# Patient Record
Sex: Female | Born: 1965 | Race: Black or African American | Hispanic: No | Marital: Single | Smoking: Current some day smoker
Health system: Southern US, Community
[De-identification: ages and names within clinical notes are randomized; demographics above are authoritative.]

## PROBLEM LIST (undated history)

## (undated) DIAGNOSIS — M1990 Unspecified osteoarthritis, unspecified site: Secondary | ICD-10-CM

## (undated) DIAGNOSIS — K219 Gastro-esophageal reflux disease without esophagitis: Secondary | ICD-10-CM

## (undated) HISTORY — PX: OTHER SURGICAL HISTORY: SHX169

## (undated) HISTORY — PX: ABDOMINAL HYSTERECTOMY: SHX81

## (undated) HISTORY — PX: FACIAL RECONSTRUCTION SURGERY: SHX631

## (undated) HISTORY — PX: FRACTURE SURGERY: SHX138

---

## 2005-06-03 ENCOUNTER — Ambulatory Visit: Payer: Self-pay | Admitting: Family Medicine

## 2005-08-20 ENCOUNTER — Ambulatory Visit: Payer: Self-pay | Admitting: Unknown Physician Specialty

## 2012-04-06 ENCOUNTER — Emergency Department: Payer: Self-pay | Admitting: Emergency Medicine

## 2013-07-02 DIAGNOSIS — M79671 Pain in right foot: Secondary | ICD-10-CM | POA: Insufficient documentation

## 2013-07-02 DIAGNOSIS — IMO0001 Reserved for inherently not codable concepts without codable children: Secondary | ICD-10-CM | POA: Insufficient documentation

## 2019-07-21 ENCOUNTER — Encounter: Payer: Self-pay | Admitting: Family Medicine

## 2019-07-22 ENCOUNTER — Encounter: Payer: Self-pay | Admitting: Family Medicine

## 2019-07-22 ENCOUNTER — Other Ambulatory Visit: Payer: Self-pay

## 2019-07-22 ENCOUNTER — Ambulatory Visit (INDEPENDENT_AMBULATORY_CARE_PROVIDER_SITE_OTHER): Payer: Managed Care, Other (non HMO) | Admitting: Family Medicine

## 2019-07-22 VITALS — BP 110/64 | HR 92 | Ht 65.0 in | Wt 175.0 lb

## 2019-07-22 DIAGNOSIS — Z7689 Persons encountering health services in other specified circumstances: Secondary | ICD-10-CM

## 2019-07-22 DIAGNOSIS — R933 Abnormal findings on diagnostic imaging of other parts of digestive tract: Secondary | ICD-10-CM | POA: Diagnosis not present

## 2019-07-22 DIAGNOSIS — Z1211 Encounter for screening for malignant neoplasm of colon: Secondary | ICD-10-CM

## 2019-07-22 DIAGNOSIS — Z23 Encounter for immunization: Secondary | ICD-10-CM

## 2019-07-22 DIAGNOSIS — Z124 Encounter for screening for malignant neoplasm of cervix: Secondary | ICD-10-CM

## 2019-07-22 DIAGNOSIS — E663 Overweight: Secondary | ICD-10-CM

## 2019-07-22 NOTE — Patient Instructions (Signed)

## 2019-07-22 NOTE — Progress Notes (Signed)
Date:  07/22/2019   Name:  Debbie Harrington   DOB:  March 16, 1966   MRN:  [id number]   Chief Complaint: Establish Care (needed pcp), ref GYN (GYN maintenance and weight loss discussion), and ref GI (Needs baseline colonoscopy)  Patient is a 54 year old female who presents for a establish care exam. The patient reports the following problems: colon cancer screen. Health maintenance has been reviewed colonoscopy/cervical cancer screen/breast cancer screen.   No results found for: CREATININE, BUN, NA, K, CL, CO2 No results found for: CHOL, HDL, LDLCALC, LDLDIRECT, TRIG, CHOLHDL No results found for: TSH No results found for: HGBA1C No results found for: WBC, HGB, HCT, MCV, PLT No results found for: ALT, AST, GGT, ALKPHOS, BILITOT   Review of Systems  Constitutional: Negative for chills and fever.  HENT: Negative for drooling, ear discharge, ear pain, rhinorrhea, sinus pain and sore throat.   Respiratory: Negative for cough, shortness of breath and wheezing.   Cardiovascular: Negative for chest pain, palpitations and leg swelling.  Gastrointestinal: Negative for abdominal pain, blood in stool, constipation, diarrhea and nausea.  Endocrine: Negative for polydipsia and polyuria.  Genitourinary: Negative for dysuria, frequency, hematuria, urgency, vaginal bleeding, vaginal discharge and vaginal pain.  Musculoskeletal: Negative for back pain, myalgias and neck pain.  Skin: Negative for rash.  Allergic/Immunologic: Negative for environmental allergies.  Neurological: Negative for dizziness, weakness and headaches.  Hematological: Does not bruise/bleed easily.  Psychiatric/Behavioral: Negative for suicidal ideas. The patient is not nervous/anxious.     Patient Active Problem List   Diagnosis Date Noted  . Right foot pain 07/02/2013  . Reflux 07/02/2013    No Known Allergies  Past Surgical History:  Procedure Laterality Date  . ABDOMINAL HYSTERECTOMY    . arm fracture    . FACIAL  RECONSTRUCTION SURGERY      Social History   Tobacco Use  . Smoking status: Current Every Day Smoker    Types: Cigarettes  . Smokeless tobacco: Never Used  Substance Use Topics  . Alcohol use: Yes    Comment: socially  . Drug use: Never     Medication list has been reviewed and updated.  No outpatient medications have been marked as taking for the 07/22/19 encounter (Office Visit) with Juline Patch, MD.    [facility] 2/9 Scores 07/22/2019  PHQ - 2 Score 0  PHQ- 9 Score 0    BP Readings from Last 3 Encounters:  07/22/19 110/64    Physical Exam Vitals and nursing note reviewed.  Constitutional:      General: She is not in acute distress.    Appearance: She is not diaphoretic.  HENT:     Head: Normocephalic and atraumatic.     Right Ear: Tympanic membrane, ear canal and external ear normal.     Left Ear: Tympanic membrane, ear canal and external ear normal.     Nose: Nose normal. No congestion or rhinorrhea.  Eyes:     General:        Right eye: No discharge.        Left eye: No discharge.     Conjunctiva/sclera: Conjunctivae normal.     Pupils: Pupils are equal, round, and reactive to light.  Neck:     Thyroid: No thyromegaly.     Vascular: No JVD.  Cardiovascular:     Rate and Rhythm: Normal rate and regular rhythm.     Heart sounds: Normal heart sounds. No murmur. No friction rub. No gallop.  Pulmonary:     Effort: Pulmonary effort is normal.     Breath sounds: Normal breath sounds. No wheezing or rhonchi.  Abdominal:     General: Bowel sounds are normal.     Palpations: Abdomen is soft. There is no mass.     Tenderness: There is no abdominal tenderness. There is no guarding.  Musculoskeletal:        General: Normal range of motion.     Cervical back: Normal range of motion and neck supple.  Lymphadenopathy:     Cervical: No cervical adenopathy.  Skin:    General: Skin is warm and dry.     Findings: No bruising or erythema.  Neurological:     Mental  Status: She is alert.     Deep Tendon Reflexes: Reflexes are normal and symmetric.     Wt Readings from Last 3 Encounters:  07/22/19 175 lb (79.4 kg)    BP 110/64   Pulse 92   Ht 5\' 5"  (1.651 m)   Wt 175 lb (79.4 kg)   BMI 29.12 kg/m    Assessment and Plan: 1. Establishing care with new doctor, encounter for Patient establishing care with new physician.  Review of previous encounters, most recent labs, most recent imaging, as well as care everywhere were reviewed.  2. Need for Tdap vaccination Discussed and administered. - Tdap vaccine greater than or equal to 7yo IM  3. Colon cancer screening Patient desires to proceed with evaluation for colonoscopy. - Ambulatory referral to Gastroenterology  4. Abnormal finding on GI tract imaging In looking at previous imaging it was noted in 2007 patient had barium swallow and that there was some abnormality noted.  Patient has not had any weight loss or abdominal pain but would like to bring this to the attention of gastroenterology whether any further evaluation needs to be done such as endoscopy. - Ambulatory referral to Gastroenterology  5. Overweight (BMI 25.0-29.9) Health risks of being over weight were discussed and patient was counseled on weight loss options and exercise.  6. Cervical cancer screening Patient needs to be evaluated for cervical cancer screening by gynecology.  Patient used to see 01-25-2001 but now she would like to see Dr. Willeen Cass for weight loss and continued GYN care. - Ambulatory referral to Gynecology

## 2019-08-04 ENCOUNTER — Other Ambulatory Visit: Payer: Self-pay | Admitting: Obstetrics and Gynecology

## 2019-08-04 DIAGNOSIS — Z1231 Encounter for screening mammogram for malignant neoplasm of breast: Secondary | ICD-10-CM

## 2019-08-13 LAB — HM PAP SMEAR: HM Pap smear: NORMAL

## 2019-08-24 ENCOUNTER — Ambulatory Visit
Admission: RE | Admit: 2019-08-24 | Discharge: 2019-08-24 | Disposition: A | Discharge status: Home / Self Care | Payer: Managed Care, Other (non HMO) | Source: Ambulatory Visit | Attending: Obstetrics and Gynecology | Admitting: Obstetrics and Gynecology

## 2019-08-24 ENCOUNTER — Other Ambulatory Visit: Payer: Self-pay

## 2019-08-24 DIAGNOSIS — Z1231 Encounter for screening mammogram for malignant neoplasm of breast: Secondary | ICD-10-CM

## 2019-08-27 ENCOUNTER — Inpatient Hospital Stay
Admission: RE | Admit: 2019-08-27 | Discharge: 2019-08-27 | Disposition: A | Discharge status: Home / Self Care | Payer: Self-pay | Source: Ambulatory Visit | Attending: *Deleted | Admitting: *Deleted

## 2019-08-27 ENCOUNTER — Other Ambulatory Visit: Payer: Self-pay | Admitting: *Deleted

## 2019-08-27 DIAGNOSIS — Z1231 Encounter for screening mammogram for malignant neoplasm of breast: Secondary | ICD-10-CM

## 2019-10-28 ENCOUNTER — Other Ambulatory Visit: Payer: Self-pay

## 2019-10-28 ENCOUNTER — Ambulatory Visit (INDEPENDENT_AMBULATORY_CARE_PROVIDER_SITE_OTHER): Payer: Managed Care, Other (non HMO) | Admitting: Family Medicine

## 2019-10-28 ENCOUNTER — Encounter: Payer: Self-pay | Admitting: Family Medicine

## 2019-10-28 VITALS — BP 128/88 | HR 88 | Ht 65.0 in | Wt 172.0 lb

## 2019-10-28 DIAGNOSIS — M7661 Achilles tendinitis, right leg: Secondary | ICD-10-CM

## 2019-10-28 DIAGNOSIS — M722 Plantar fascial fibromatosis: Secondary | ICD-10-CM | POA: Diagnosis not present

## 2019-10-28 MED ORDER — MELOXICAM 15 MG PO TABS: 15.0000 mg | ORAL_TABLET | Freq: Every day | ORAL | 0 refills | Status: DC

## 2019-10-28 NOTE — Progress Notes (Signed)
Date:  10/28/2019   Name:  Debbie Harrington   DOB:  1965-08-31   MRN:  [id number]   Chief Complaint: Follow-up (tendonitis right ankle- doing good with boot on(took boot off 10/16/19) ,put brace on 10/21/19 back or ankle and heel hurts sends sharp pain up legs when she puts pressure on it )  Leg Pain  The incident occurred more than 1 week ago. There was no injury mechanism. The pain is present in the right foot. The quality of the pain is described as aching. The pain is moderate. The pain has been constant since onset. Pertinent negatives include no inability to bear weight, loss of motion, loss of sensation, muscle weakness or numbness. Associated symptoms comments: Pain with walking. She has tried NSAIDs for the symptoms. The treatment provided moderate relief.    No results found for: CREATININE, BUN, NA, K, CL, CO2 No results found for: CHOL, HDL, LDLCALC, LDLDIRECT, TRIG, CHOLHDL No results found for: TSH No results found for: HGBA1C No results found for: WBC, HGB, HCT, MCV, PLT No results found for: ALT, AST, GGT, ALKPHOS, BILITOT   Review of Systems  Constitutional: Negative.  Negative for chills, fatigue, fever and unexpected weight change.  HENT: Negative for congestion, ear discharge, ear pain, rhinorrhea, sinus pressure, sneezing and sore throat.   Eyes: Negative for photophobia, pain, discharge, redness and itching.  Respiratory: Negative for cough, shortness of breath, wheezing and stridor.   Gastrointestinal: Negative for abdominal pain, blood in stool, constipation, diarrhea, nausea and vomiting.  Endocrine: Negative for cold intolerance, heat intolerance, polydipsia, polyphagia and polyuria.  Genitourinary: Negative for dysuria, flank pain, frequency, hematuria, menstrual problem, pelvic pain, urgency, vaginal bleeding and vaginal discharge.  Musculoskeletal: Negative for arthralgias, back pain and myalgias.  Skin: Negative for rash.  Allergic/Immunologic: Negative for  environmental allergies and food allergies.  Neurological: Negative for dizziness, weakness, light-headedness, numbness and headaches.  Hematological: Negative for adenopathy. Does not bruise/bleed easily.  Psychiatric/Behavioral: Negative for dysphoric mood. The patient is not nervous/anxious.     Patient Active Problem List   Diagnosis Date Noted  . Right foot pain 07/02/2013  . Reflux 07/02/2013    No Known Allergies  Past Surgical History:  Procedure Laterality Date  . ABDOMINAL HYSTERECTOMY    . arm fracture    . FACIAL RECONSTRUCTION SURGERY      Social History   Tobacco Use  . Smoking status: Current Every Day Smoker    Types: Cigarettes  . Smokeless tobacco: Never Used  Substance Use Topics  . Alcohol use: Yes    Comment: socially  . Drug use: Never     Medication list has been reviewed and updated.  Current Meds  Medication Sig  . ibuprofen (ADVIL) 600 MG tablet Take 600 mg by mouth every 6 (six) hours as needed.    PHQ 2/9 Scores 10/28/2019 07/22/2019  PHQ - 2 Score 0 0  PHQ- 9 Score 0 0    GAD 7 : Generalized Anxiety Score 10/28/2019 07/22/2019  Nervous, Anxious, on Edge 0 0  Control/stop worrying 0 0  Worry too much - different things 0 0  Trouble relaxing 0 0  Restless 0 0  Easily annoyed or irritable 0 0  Afraid - awful might happen 0 0  Total GAD 7 Score 0 0  Anxiety Difficulty Not difficult at all -    BP Readings from Last 3 Encounters:  10/28/19 128/88  07/22/19 110/64    Physical Exam Vitals and  nursing note reviewed.  Constitutional:      Appearance: She is well-developed.  HENT:     Head: Normocephalic.     Right Ear: Tympanic membrane, ear canal and external ear normal.     Left Ear: Tympanic membrane, ear canal and external ear normal.  Eyes:     General: Lids are everted, no foreign bodies appreciated. No scleral icterus.       Left eye: No foreign body or hordeolum.     Conjunctiva/sclera: Conjunctivae normal.     Right  eye: Right conjunctiva is not injected.     Left eye: Left conjunctiva is not injected.     Pupils: Pupils are equal, round, and reactive to light.  Neck:     Thyroid: No thyromegaly.     Vascular: No JVD.     Trachea: No tracheal deviation.  Cardiovascular:     Rate and Rhythm: Normal rate and regular rhythm.     Heart sounds: Normal heart sounds. No murmur heard.  No friction rub. No gallop.   Pulmonary:     Effort: Pulmonary effort is normal. No respiratory distress.     Breath sounds: Normal breath sounds. No wheezing or rales.  Abdominal:     General: Bowel sounds are normal.     Palpations: Abdomen is soft. There is no mass.     Tenderness: There is no abdominal tenderness. There is no guarding or rebound.  Musculoskeletal:        General: Normal range of motion.     Cervical back: Normal range of motion and neck supple.     Right ankle: Tenderness present.     Right Achilles Tendon: Tenderness present. Thompson's test positive.     Right foot: Tenderness present.     Comments: Plantar fascia origin  Lymphadenopathy:     Cervical: No cervical adenopathy.  Skin:    General: Skin is warm.     Findings: No rash.  Neurological:     Mental Status: She is alert and oriented to person, place, and time.     Cranial Nerves: No cranial nerve deficit.     Deep Tendon Reflexes: Reflexes normal.  Psychiatric:        Mood and Affect: Mood is not anxious or depressed.     Wt Readings from Last 3 Encounters:  10/28/19 172 lb (78 kg)  07/22/19 175 lb (79.4 kg)    BP 128/88   Pulse 88   Ht 5\' 5"  (1.651 m)   Wt 172 lb (78 kg)   SpO2 99%   BMI 28.62 kg/m   Assessment and Plan:  1. Achilles tendinitis of right lower extremity New onset.  Persistent.  Despite using a walking boot and a brace for several weeks as well as ibuprofen patient has only had partial relief of discomfort of the Achilles tendinitis.  We will refer to podiatry and will replace ibuprofen with meloxicam 15  mg once a day. - meloxicam (MOBIC) 15 MG tablet; Take 1 tablet (15 mg total) by mouth daily.  Dispense: 30 tablet; Refill: 0 - Ambulatory referral to Podiatry  2. Plantar fasciitis New onset developed after wearing boot.  Persistent.  Patient has been wearing shoes with no arch support such as flip-flops or light loafers.  Patient's developed a plantar fasciitis of the same foot with the Achilles tendinitis probably due to abnormal walking with boot or brace.  As noted above we have initiated meloxicam 15 mg daily and patient is referred to  podiatry for evaluation for this as well. - meloxicam (MOBIC) 15 MG tablet; Take 1 tablet (15 mg total) by mouth daily.  Dispense: 30 tablet; Refill: 0 - Ambulatory referral to Podiatry

## 2019-10-28 NOTE — Patient Instructions (Signed)
Plantar Fasciitis  Plantar fasciitis is a painful foot condition that affects the heel. It occurs when the band of tissue that connects the toes to the heel bone (plantar fascia) becomes irritated. This can happen as the result of exercising too much or doing other repetitive activities (overuse injury). The pain from plantar fasciitis can range from mild irritation to severe pain that makes it difficult to walk or move. The pain is usually worse in the morning after sleeping, or after sitting or lying down for a while. Pain may also be worse after long periods of walking or standing. What are the causes? This condition may be caused by:  Standing for long periods of time.  Wearing shoes that do not have good arch support.  Doing activities that put stress on joints (high-impact activities), including running, aerobics, and ballet.  Being overweight.  An abnormal way of walking (gait).  Tight muscles in the back of your lower leg (calf).  High arches in your feet.  Starting a new athletic activity. What are the signs or symptoms? The main symptom of this condition is heel pain. Pain may:  Be worse with first steps after a time of rest, especially in the morning after sleeping or after you have been sitting or lying down for a while.  Be worse after long periods of standing still.  Decrease after 30-45 minutes of activity, such as gentle walking. How is this diagnosed? This condition may be diagnosed based on your medical history and your symptoms. Your health care provider may ask questions about your activity level. Your health care provider will do a physical exam to check for:  A tender area on the bottom of your foot.  A high arch in your foot.  Pain when you move your foot.  Difficulty moving your foot. You may have imaging tests to confirm the diagnosis, such as:  X-rays.  Ultrasound.  MRI. How is this treated? Treatment for plantar fasciitis depends on how  severe your condition is. Treatment may include:  Rest, ice, applying pressure (compression), and raising the affected foot (elevation). This may be called RICE therapy. Your health care provider may recommend RICE therapy along with over-the-counter pain medicines to manage your pain.  Exercises to stretch your calves and your plantar fascia.  A splint that holds your foot in a stretched, upward position while you sleep (night splint).  Physical therapy to relieve symptoms and prevent problems in the future.  Injections of steroid medicine (cortisone) to relieve pain and inflammation.  Stimulating your plantar fascia with electrical impulses (extracorporeal shock wave therapy). This is usually the last treatment option before surgery.  Surgery, if other treatments have not worked after 12 months. Follow these instructions at home:  Managing pain, stiffness, and swelling  If directed, put ice on the painful area: ? Put ice in a plastic bag, or use a frozen bottle of water. ? Place a towel between your skin and the bag or bottle. ? Roll the bottom of your foot over the bag or bottle. ? Do this for 20 minutes, 2-3 times a day.  Wear athletic shoes that have air-sole or gel-sole cushions, or try wearing soft shoe inserts that are designed for plantar fasciitis.  Raise (elevate) your foot above the level of your heart while you are sitting or lying down. Activity  Avoid activities that cause pain. Ask your health care provider what activities are safe for you.  Do physical therapy exercises and stretches as told   by your health care provider.  Try activities and forms of exercise that are easier on your joints (low-impact). Examples include swimming, water aerobics, and biking. General instructions  Take over-the-counter and prescription medicines only as told by your health care provider.  Wear a night splint while sleeping, if told by your health care provider. Loosen the splint  if your toes tingle, become numb, or turn cold and blue.  Maintain a healthy weight, or work with your health care provider to lose weight as needed.  Keep all follow-up visits as told by your health care provider. This is important. Contact a health care provider if you:  Have symptoms that do not go away after caring for yourself at home.  Have pain that gets worse.  Have pain that affects your ability to move or do your daily activities. Summary  Plantar fasciitis is a painful foot condition that affects the heel. It occurs when the band of tissue that connects the toes to the heel bone (plantar fascia) becomes irritated.  The main symptom of this condition is heel pain that may be worse after exercising too much or standing still for a long time.  Treatment varies, but it usually starts with rest, ice, compression, and elevation (RICE therapy) and over-the-counter medicines to manage pain. This information is not intended to replace advice given to you by your health care provider. Make sure you discuss any questions you have with your health care provider. Document Revised: 02/21/2017 Document Reviewed: 01/06/2017 Elsevier Patient Education  2020 [employer] Inc[employer] Rosen's Emergency Medicine: Concepts and Clinical Practice (9th ed., pp. [phone]). [place], [place]: Else[employer] Inc. Retrieved from https://www.clinicalkey.com/#!/content/book/3-s2.0-B9780323354790001070?scrollTo=%23hl0000251">  Achilles Tendinitis  Achilles tendinitis is inflammation of the tough, cord-like band that attaches the lower leg muscles to the heel bone (Achilles tendon). This is usually caused by overusing the tendon and the ankle joint. Achilles tendinitis usually gets better over time with treatment and caring for yourself at home. It can take weeks or months to heal completely. What are the causes? This condition may be caused by:  A sudden increase in exercise or activity, such as running.  Doing the same  exercises or activities, such as jumping, over and over.  Not warming up calf muscles before exercising.  Exercising in shoes that are worn out or not made for exercise.  Having arthritis or a bone growth (spur) on the back of the heel bone. This can rub against the tendon and hurt it.  Age-related wear and tear. Tendons become less flexible with age and are more likely to be injured. What are the signs or symptoms? Common symptoms of this condition include:  Pain in the Achilles tendon or in the back of the leg, just above the heel. The pain usually gets worse with exercise.  Stiffness or soreness in the back of the leg, especially in the morning.  Swelling of the skin over the Achilles tendon.  Thickening of the tendon.  Trouble standing on tiptoe. How is this diagnosed? This condition is diagnosed based on your symptoms and a physical exam. You may have tests, including:  X-rays.  MRI. How is this treated? The goal of treatment is to relieve symptoms and help your injury heal. Treatment may include:  Decreasing or stopping activities that caused the tendinitis. This may mean switching to low-impact exercises like biking or swimming.  Icing the injured area.  Doing physical therapy, including strengthening and stretching exercises.  Taking NSAIDs, such as ibuprofen, to help relieve pain and  swelling.  Using supportive shoes, wraps, heel lifts, or a walking boot (air cast).  Having surgery. This may be done if your symptoms do not improve after other treatments.  Using high-energy shock wave impulses to stimulate the healing process (extracorporeal shock wave therapy). This is rare.  Having an injection of medicines that help relieve inflammation (corticosteroids). This is rare. Follow these instructions at home: If you have an air cast:  Wear the air cast as told by your health care provider. Remove it only as told by your health care provider.  Loosen it if your  toes tingle, become numb, or turn cold and blue.  Keep it clean.  If the air cast is not waterproof: ? Do not let it get wet. ? Cover it with a watertight covering when you take a bath or shower. Managing pain, stiffness, and swelling   If directed, put ice on the injured area. To do this: ? If you have a removable air cast, remove it as told by your health care provider. ? Put ice in a plastic bag. ? Place a towel between your skin and the bag. ? Leave the ice on for 20 minutes, 2-3 times a day.  Move your toes often to reduce stiffness and swelling.  Raise (elevate) your foot above the level of your heart while you are sitting or lying down. Activity  Gradually return to your normal activities as told by your health care provider. Ask your health care provider what activities are safe for you.  Do not do activities that cause pain.  Consider doing low-impact exercises, like cycling or swimming.  Ask your health care provider when it is safe to drive if you have an air cast on your foot.  If physical therapy was prescribed, do exercises as told by your health care provider or physical therapist. General instructions  If directed, wrap your foot with an elastic bandage or other wrap. This can help to keep your tendon from moving too much while it heals. Your health care provider will show you how to wrap your foot correctly.  Wear supportive shoes or heel lifts only as told by your health care provider.  Take over-the-counter and prescription medicines only as told by your health care provider.  Keep all follow-up visits as told by your health care provider. This is important. Contact a health care provider if you:  Have symptoms that get worse.  Have pain that does not get better with medicine.  Develop new, unexplained symptoms.  Develop warmth and swelling in your foot.  Have a fever. Get help right away if you:  Have a sudden popping sound or sensation in your  Achilles tendon followed by severe pain.  Cannot move your toes or foot.  Cannot put any weight on your foot.  Your foot or toes become numb and look white or blue even after loosening your bandage or air cast. Summary  Achilles tendinitis is inflammation of the tough, cord-like band that attaches the lower leg muscles to the heel bone (Achilles tendon).  This condition is usually caused by overusing the tendon and the ankle joint. It can also be caused by arthritis or normal aging.  The most common symptoms of this condition include pain, swelling, or stiffness in the Achilles tendon or in the back of the leg.  This condition is usually treated by decreasing or stopping activities that caused the tendinitis, icing the injured area, taking NSAIDs, and doing physical therapy. This information is  not intended to replace advice given to you by your health care provider. Make sure you discuss any questions you have with your health care provider. Document Revised: 07/27/2018 Document Reviewed: 07/27/2018 Elsevier Patient Education  2020 [employer].

## 2019-11-11 ENCOUNTER — Telehealth: Payer: Self-pay

## 2019-11-11 NOTE — Telephone Encounter (Signed)
Called and spoke with patient. She dropped off a handicap placard form for Dr Yetta Barre to fill out because she has foot pain. Explained to her if she does not have CHF, or is not disabled in a wheelchair then Dr Yetta Barre cannot fill this out for her.   She said podiatry told her to ask [place] but she verbalize understanding of this .    CM

## 2019-11-24 ENCOUNTER — Other Ambulatory Visit: Payer: Self-pay | Admitting: Family Medicine

## 2019-11-24 DIAGNOSIS — M722 Plantar fascial fibromatosis: Secondary | ICD-10-CM

## 2019-11-24 DIAGNOSIS — M7661 Achilles tendinitis, right leg: Secondary | ICD-10-CM

## 2020-01-13 ENCOUNTER — Other Ambulatory Visit
Admission: RE | Admit: 2020-01-13 | Discharge: 2020-01-13 | Disposition: A | Discharge status: Home / Self Care | Payer: Managed Care, Other (non HMO) | Source: Ambulatory Visit | Attending: Gastroenterology | Admitting: Gastroenterology

## 2020-01-13 ENCOUNTER — Other Ambulatory Visit: Payer: Self-pay

## 2020-01-13 DIAGNOSIS — Z01812 Encounter for preprocedural laboratory examination: Secondary | ICD-10-CM | POA: Insufficient documentation

## 2020-01-13 DIAGNOSIS — Z20822 Contact with and (suspected) exposure to covid-19: Secondary | ICD-10-CM | POA: Insufficient documentation

## 2020-01-14 ENCOUNTER — Encounter: Payer: Self-pay | Admitting: *Deleted

## 2020-01-14 LAB — SARS CORONAVIRUS 2 (TAT 6-24 HRS): SARS Coronavirus 2: NEGATIVE

## 2020-01-17 ENCOUNTER — Ambulatory Visit
Admission: RE | Admit: 2020-01-17 | Discharge: 2020-01-17 | Disposition: A | Discharge status: Home / Self Care | Payer: Managed Care, Other (non HMO) | Attending: Gastroenterology | Admitting: Gastroenterology

## 2020-01-17 ENCOUNTER — Ambulatory Visit: Payer: Managed Care, Other (non HMO) | Admitting: Anesthesiology

## 2020-01-17 ENCOUNTER — Encounter
Admission: RE | Disposition: A | Discharge status: Home / Self Care | Payer: Self-pay | Source: Home / Self Care | Attending: Gastroenterology

## 2020-01-17 ENCOUNTER — Other Ambulatory Visit: Payer: Self-pay

## 2020-01-17 ENCOUNTER — Encounter: Payer: Self-pay | Admitting: *Deleted

## 2020-01-17 DIAGNOSIS — M1990 Unspecified osteoarthritis, unspecified site: Secondary | ICD-10-CM | POA: Insufficient documentation

## 2020-01-17 DIAGNOSIS — K640 First degree hemorrhoids: Secondary | ICD-10-CM | POA: Diagnosis not present

## 2020-01-17 DIAGNOSIS — Z791 Long term (current) use of non-steroidal anti-inflammatories (NSAID): Secondary | ICD-10-CM | POA: Diagnosis not present

## 2020-01-17 DIAGNOSIS — K621 Rectal polyp: Secondary | ICD-10-CM | POA: Insufficient documentation

## 2020-01-17 DIAGNOSIS — Z1211 Encounter for screening for malignant neoplasm of colon: Secondary | ICD-10-CM | POA: Diagnosis present

## 2020-01-17 DIAGNOSIS — K635 Polyp of colon: Secondary | ICD-10-CM | POA: Insufficient documentation

## 2020-01-17 DIAGNOSIS — F17200 Nicotine dependence, unspecified, uncomplicated: Secondary | ICD-10-CM | POA: Insufficient documentation

## 2020-01-17 HISTORY — PX: COLONOSCOPY WITH PROPOFOL: SHX5780

## 2020-01-17 HISTORY — DX: Unspecified osteoarthritis, unspecified site: M19.90

## 2020-01-17 HISTORY — DX: Gastro-esophageal reflux disease without esophagitis: K21.9

## 2020-01-17 SURGERY — COLONOSCOPY WITH PROPOFOL
Anesthesia: General

## 2020-01-17 MED ORDER — SODIUM CHLORIDE 0.9 % IV SOLN: INTRAVENOUS | Status: DC

## 2020-01-17 MED ORDER — PROPOFOL 10 MG/ML IV BOLUS
INTRAVENOUS | Status: DC | PRN
  Administered 2020-01-17: 100 mg via INTRAVENOUS

## 2020-01-17 MED ORDER — PROPOFOL 500 MG/50ML IV EMUL
INTRAVENOUS | Status: DC | PRN
  Administered 2020-01-17: 130 ug/kg/min via INTRAVENOUS

## 2020-01-17 MED ORDER — PROPOFOL 500 MG/50ML IV EMUL
INTRAVENOUS | Status: AC
  Filled 2020-01-17: qty 50

## 2020-01-17 NOTE — H&P (Signed)
Outpatient short stay form Pre-procedure 01/17/2020 9:30 AM Merlyn Lot MD, MPH  Primary Physician: Dr. Yetta Barre  Reason for visit:  Screening  History of present illness:   54 y/o lady with no family history of GI malignancies here for screening colonoscopy. History of hysterectomy. No blood thinners. Had colonoscopy many years ago but we do not have these records.    Current Facility-Administered Medications:  .  0.9 %  sodium chloride infusion, , Intravenous, Continuous, Keino Placencia, Rossie Muskrat, MD, Last Rate: 20 mL/hr at 01/17/20 0913, New Bag at 01/17/20 0913  Medications Prior to Admission  Medication Sig Dispense Refill Last Dose  . ibuprofen (ADVIL) 600 MG tablet Take 600 mg by mouth every 6 (six) hours as needed.   Past Week at Unknown time  . meloxicam (MOBIC) 15 MG tablet TAKE 1 TABLET(15 MG) BY MOUTH DAILY 30 tablet 0 Past Week at Unknown time     No Known Allergies   Past Medical History:  Diagnosis Date  . Arthritis   . GERD (gastroesophageal reflux disease)     Review of systems:  Otherwise negative.    Physical Exam  Gen: Alert, oriented. Appears stated age.  HEENT: [place]/AT. PERRLA. Lungs: No respiratory distress CV: RRR Abd: soft, benign, no masses. Ext: No edema.     Planned procedures: Proceed with colonoscopy. The patient understands the nature of the planned procedure, indications, risks, alternatives and potential complications including but not limited to bleeding, infection, perforation, damage to internal organs and possible oversedation/side effects from anesthesia. The patient agrees and gives consent to proceed.  Please refer to procedure notes for findings, recommendations and patient disposition/instructions.     Merlyn Lot MD, MPH Gastroenterology 01/17/2020  9:30 AM

## 2020-01-17 NOTE — Interval H&P Note (Signed)
History and Physical Interval Note:  01/17/2020 9:32 AM  Debbie Harrington  has presented today for surgery, with the diagnosis of SCREEN.  The various methods of treatment have been discussed with the patient and family. After consideration of risks, benefits and other options for treatment, the patient has consented to  Procedure(s): COLONOSCOPY WITH PROPOFOL (N/A) as a surgical intervention.  The patient's history has been reviewed, patient examined, no change in status, stable for surgery.  I have reviewed the patient's chart and labs.  Questions were answered to the patient's satisfaction.     Regis Bill  Ok to proceed with colonoscopy

## 2020-01-17 NOTE — Transfer of Care (Signed)
Immediate Anesthesia Transfer of Care Note  Patient: Debbie Harrington  Procedure(s) Performed: COLONOSCOPY WITH PROPOFOL (N/A )  Patient Location: Endoscopy Unit  Anesthesia Type:General  Level of Consciousness: drowsy and patient cooperative  Airway & Oxygen Therapy: Patient Spontanous Breathing  Post-op Assessment: Report given to RN and Post -op Vital signs reviewed and stable  Post vital signs: Reviewed and stable  Last Vitals:  Vitals Value Taken Time  BP 102/74 01/17/20 1002  Temp 36.1 C 01/17/20 1002  Pulse 72 01/17/20 1004  Resp 19 01/17/20 1004  SpO2 99 % 01/17/20 1004  Vitals shown include unvalidated device data.  Last Pain:  Vitals:   01/17/20 1002  TempSrc: Temporal  PainSc: Asleep         Complications: No complications documented.

## 2020-01-17 NOTE — Op Note (Signed)
[facility] Patient Name: Debbie Harrington Procedure Date: 01/17/2020 9:33 AM MRN: [id number] Account #: [id number] Date of Birth: 1965-04-15 Admit Type: Outpatient Age: 54 Room: [facility] ENDO ROOM 4 Gender: Female Note Status: Finalized Procedure:             Colonoscopy Indications:           Screening for colorectal malignant neoplasm Providers:             Andrey Farmer MD, MD Medicines:             Monitored Anesthesia Care Complications:         No immediate complications. Estimated blood loss:                         Minimal. Procedure:             Pre-Anesthesia Assessment:                        - Prior to the procedure, a History and Physical was                         performed, and patient medications and allergies were                         reviewed. The patient is competent. The risks and                         benefits of the procedure and the sedation options and                         risks were discussed with the patient. All questions                         were answered and informed consent was obtained.                         Patient identification and proposed procedure were                         verified by the physician, the nurse, the anesthetist                         and the technician in the endoscopy suite. Mental                         Status Examination: alert and oriented. Airway                         Examination: normal oropharyngeal airway and neck                         mobility. Respiratory Examination: clear to                         auscultation. CV Examination: normal. Prophylactic                         Antibiotics: The patient does not require prophylactic  antibiotics. Prior Anticoagulants: The patient has                         taken no previous anticoagulant or antiplatelet                         agents. ASA Grade Assessment: II - A patient with mild                          systemic disease. After reviewing the risks and                         benefits, the patient was deemed in satisfactory                         condition to undergo the procedure. The anesthesia                         plan was to use monitored anesthesia care (MAC).                         Immediately prior to administration of medications,                         the patient was re-assessed for adequacy to receive                         sedatives. The heart rate, respiratory rate, oxygen                         saturations, blood pressure, adequacy of pulmonary                         ventilation, and response to care were monitored                         throughout the procedure. The physical status of the                         patient was re-assessed after the procedure.                        After obtaining informed consent, the colonoscope was                         passed under direct vision. Throughout the procedure,                         the patient's blood pressure, pulse, and oxygen                         saturations were monitored continuously. The                         Colonoscope was introduced through the anus and                         advanced to the the terminal ileum. The colonoscopy  was performed without difficulty. The patient                         tolerated the procedure well. The quality of the bowel                         preparation was excellent. Findings:      The perianal and digital rectal examinations were normal.      The terminal ileum appeared normal.      A 3 mm polyp was found in the sigmoid colon. The polyp was sessile. The       polyp was removed with a cold snare. Resection and retrieval were       complete. Estimated blood loss was minimal.      Three sessile polyps were found in the rectum. The polyps were 2 to 3 mm       in size. These polyps were removed with a cold snare. Resection and        retrieval were complete. Estimated blood loss was minimal.      Non-bleeding internal hemorrhoids were found during retroflexion. The       hemorrhoids were Grade I (internal hemorrhoids that do not prolapse).      The exam was otherwise without abnormality on direct and retroflexion       views. Impression:            - The examined portion of the ileum was normal.                        - One 3 mm polyp in the sigmoid colon, removed with a                         cold snare. Resected and retrieved.                        - Three 2 to 3 mm polyps in the rectum, removed with a                         cold snare. Resected and retrieved.                        - Non-bleeding internal hemorrhoids.                        - The examination was otherwise normal on direct and                         retroflexion views. Recommendation:        - Discharge patient to home.                        - Resume previous diet.                        - Continue present medications.                        - Await pathology results.                        - Repeat colonoscopy for surveillance  based on                         pathology results.                        - Return to referring physician as previously                         scheduled. Procedure Code(s):     --- Professional ---                        [phone], Colonoscopy, flexible; with removal of                         tumor(s), polyp(s), or other lesion(s) by snare                         technique Diagnosis Code(s):     --- Professional ---                        Z12.11, Encounter for screening for malignant neoplasm                         of colon                        K64.0, First degree hemorrhoids                        K63.5, Polyp of colon                        K62.1, Rectal polyp CPT copyright 2019 American Medical Association. All rights reserved. The codes documented in this report are preliminary and upon coder review may  be  revised to meet current compliance requirements. Andrey Farmer, MD Andrey Farmer MD, MD 01/17/2020 10:01:03 AM Number of Addenda: 0 Note Initiated On: 01/17/2020 9:33 AM Scope Withdrawal Time: 0 hours 13 minutes 6 seconds  Total Procedure Duration: 0 hours 16 minutes 38 seconds  Estimated Blood Loss:  Estimated blood loss was minimal.      [facility]

## 2020-01-17 NOTE — Anesthesia Preprocedure Evaluation (Signed)
Anesthesia Evaluation  Patient identified by MRN, date of birth, ID band Patient awake    Reviewed: Allergy & Precautions, H&P , NPO status , Patient's Chart, lab work & pertinent test results, reviewed documented beta blocker date and time   History of Anesthesia Complications Negative for: history of anesthetic complications  Airway Mallampati: III  TM Distance: >3 FB Neck ROM: full    Dental  (+) Dental Advidsory Given, Teeth Intact, Missing   Pulmonary neg shortness of breath, neg COPD, neg recent URI, Current Smoker and Patient abstained from smoking.,    Pulmonary exam normal breath sounds clear to auscultation       Cardiovascular Exercise Tolerance: Good negative cardio ROS Normal cardiovascular exam Rhythm:regular Rate:Normal     Neuro/Psych negative neurological ROS  negative psych ROS   GI/Hepatic Neg liver ROS, GERD  ,  Endo/Other  negative endocrine ROS  Renal/GU negative Renal ROS  negative genitourinary   Musculoskeletal   Abdominal   Peds  Hematology negative hematology ROS (+)   Anesthesia Other Findings Past Medical History: No date: Arthritis No date: GERD (gastroesophageal reflux disease)   Reproductive/Obstetrics negative OB ROS                             Anesthesia Physical Anesthesia Plan  ASA: II  Anesthesia Plan: General   Post-op Pain Management:    Induction: Intravenous  PONV Risk Score and Plan: 2 and Propofol infusion and TIVA  Airway Management Planned: Natural Airway and Nasal Cannula  Additional Equipment:   Intra-op Plan:   Post-operative Plan:   Informed Consent: I have reviewed the patients History and Physical, chart, labs and discussed the procedure including the risks, benefits and alternatives for the proposed anesthesia with the patient or authorized representative who has indicated his/her understanding and acceptance.      Dental Advisory Given  Plan Discussed with: Anesthesiologist, CRNA and Surgeon  Anesthesia Plan Comments:         Anesthesia Quick Evaluation

## 2020-01-17 NOTE — Anesthesia Postprocedure Evaluation (Signed)
Anesthesia Post Note  Patient: Debbie Harrington  Procedure(s) Performed: COLONOSCOPY WITH PROPOFOL (N/A )  Patient location during evaluation: Endoscopy Anesthesia Type: General Level of consciousness: awake and alert Pain management: pain level controlled Vital Signs Assessment: post-procedure vital signs reviewed and stable Respiratory status: spontaneous breathing, nonlabored ventilation, respiratory function stable and patient connected to nasal cannula oxygen Cardiovascular status: blood pressure returned to baseline and stable Postop Assessment: no apparent nausea or vomiting Anesthetic complications: no   No complications documented.   Last Vitals:  Vitals:   01/17/20 1012 01/17/20 1022  BP: 116/84 116/77  Pulse: 68 60  Resp: 20 11  Temp:    SpO2: 100% 100%    Last Pain:  Vitals:   01/17/20 1022  TempSrc:   PainSc: 0-No pain                 Lenard Simmer

## 2020-01-18 LAB — SURGICAL PATHOLOGY

## 2020-01-19 ENCOUNTER — Encounter: Payer: Self-pay | Admitting: Gastroenterology

## 2021-02-07 ENCOUNTER — Ambulatory Visit: Payer: Self-pay | Admitting: *Deleted

## 2021-02-07 ENCOUNTER — Encounter: Payer: Self-pay | Admitting: Family Medicine

## 2021-02-07 ENCOUNTER — Other Ambulatory Visit: Payer: Self-pay

## 2021-02-07 ENCOUNTER — Ambulatory Visit
Admission: RE | Admit: 2021-02-07 | Discharge: 2021-02-07 | Disposition: A | Discharge status: Home / Self Care | Payer: Self-pay | Source: Ambulatory Visit | Attending: Family Medicine | Admitting: Family Medicine

## 2021-02-07 ENCOUNTER — Ambulatory Visit
Admission: RE | Admit: 2021-02-07 | Discharge: 2021-02-07 | Disposition: A | Discharge status: Home / Self Care | Payer: Self-pay | Attending: Family Medicine | Admitting: Family Medicine

## 2021-02-07 ENCOUNTER — Ambulatory Visit (INDEPENDENT_AMBULATORY_CARE_PROVIDER_SITE_OTHER): Payer: Self-pay | Admitting: Family Medicine

## 2021-02-07 VITALS — BP 128/92 | HR 78 | Ht 65.0 in | Wt 183.0 lb

## 2021-02-07 DIAGNOSIS — M722 Plantar fascial fibromatosis: Secondary | ICD-10-CM

## 2021-02-07 DIAGNOSIS — M25561 Pain in right knee: Secondary | ICD-10-CM | POA: Insufficient documentation

## 2021-02-07 DIAGNOSIS — M7661 Achilles tendinitis, right leg: Secondary | ICD-10-CM

## 2021-02-07 MED ORDER — MELOXICAM 15 MG PO TABS: ORAL_TABLET | ORAL | 1 refills | Status: DC

## 2021-02-07 NOTE — Progress Notes (Signed)
Date:  02/07/2021   Name:  Debbie Harrington   DOB:  April 04, 1965   MRN:  [id number]   Chief Complaint: No chief complaint on file.  Knee Pain  The incident occurred 12 to 24 hours ago. The incident occurred at home. The injury mechanism was a fall (awkward out of truck/va). The pain is present in the right knee. The pain is at a severity of 6/10 (on ibuprofen). The pain is moderate. The pain has been Constant since onset. Associated symptoms include an inability to bear weight. Pertinent negatives include no loss of motion, loss of sensation, muscle weakness, numbness or tingling. The symptoms are aggravated by movement, weight bearing and palpation. Treatments tried: ibuprofen. The treatment provided mild relief.   No results found for: CREATININE, BUN, NA, K, CL, CO2 No results found for: CHOL, HDL, LDLCALC, LDLDIRECT, TRIG, CHOLHDL No results found for: TSH No results found for: HGBA1C No results found for: WBC, HGB, HCT, MCV, PLT No results found for: ALT, AST, GGT, ALKPHOS, BILITOT   Review of Systems  Constitutional:  Negative for chills and fever.  HENT:  Negative for drooling, ear discharge, ear pain and sore throat.   Respiratory:  Negative for cough, shortness of breath and wheezing.   Cardiovascular:  Negative for chest pain, palpitations and leg swelling.  Gastrointestinal:  Negative for abdominal pain, blood in stool, constipation, diarrhea and nausea.  Endocrine: Negative for polydipsia.  Genitourinary:  Negative for dysuria, frequency, hematuria and urgency.  Musculoskeletal:  Negative for back pain, myalgias and neck pain.  Skin:  Negative for rash.  Allergic/Immunologic: Negative for environmental allergies.  Neurological:  Negative for dizziness, tingling, numbness and headaches.  Hematological:  Does not bruise/bleed easily.  Psychiatric/Behavioral:  Negative for suicidal ideas. The patient is not nervous/anxious.    Patient Active Problem List   Diagnosis Date  Noted   Right foot pain 07/02/2013   Reflux 07/02/2013    No Known Allergies  Past Surgical History:  Procedure Laterality Date   ABDOMINAL HYSTERECTOMY     arm fracture     COLONOSCOPY WITH PROPOFOL N/A 01/17/2020   Procedure: COLONOSCOPY WITH PROPOFOL;  Surgeon: Regis Bill, MD;  Location: [facility] ENDOSCOPY;  Service: Endoscopy;  Laterality: N/A;   FACIAL RECONSTRUCTION SURGERY     FRACTURE SURGERY      Social History   Tobacco Use   Smoking status: Every Day    Packs/day: 1.00    Types: Cigarettes   Smokeless tobacco: Never  Vaping Use   Vaping Use: Never used  Substance Use Topics   Alcohol use: Yes    Comment: socially   Drug use: Never     Medication list has been reviewed and updated.  No outpatient medications have been marked as taking for the 02/07/21 encounter (Appointment) with Duanne Limerick, MD.    [facility] 2/9 Scores 10/28/2019 07/22/2019  PHQ - 2 Score 0 0  PHQ- 9 Score 0 0    GAD 7 : Generalized Anxiety Score 10/28/2019 07/22/2019  Nervous, Anxious, on Edge 0 0  Control/stop worrying 0 0  Worry too much - different things 0 0  Trouble relaxing 0 0  Restless 0 0  Easily annoyed or irritable 0 0  Afraid - awful might happen 0 0  Total GAD 7 Score 0 0  Anxiety Difficulty Not difficult at all -    BP Readings from Last 3 Encounters:  01/17/20 116/77  10/28/19 128/88  07/22/19 110/64  Physical Exam Vitals and nursing note reviewed.  Cardiovascular:     Pulses: Normal pulses.     Heart sounds: S1 normal and S2 normal. No murmur heard. No systolic murmur is present.  No diastolic murmur is present.    No S3 or S4 sounds.  Musculoskeletal:     Right knee: Swelling and bony tenderness present. No deformity or effusion. Decreased range of motion. Tenderness present over the medial joint line. No MCL or LCL tenderness. Normal alignment and normal patellar mobility.     Comments: Tender med jt line and tibial plateau    Wt Readings from Last  3 Encounters:  01/17/20 172 lb (78 kg)  10/28/19 172 lb (78 kg)  07/22/19 175 lb (79.4 kg)    There were no vitals taken for this visit.  Assessment and Plan:  1. Acute pain of right knee New onset.  Patient had a partial fall that she stepped out awkwardly from a elevated truck.  Patient was in varus position and has had discomfort of the medial aspect of her right knee.  Examination notes tenderness along joint line and proximal tibia.  Refilling meloxicam 15 mg once a day.  We will obtain an x-ray of the knee to rule out tibial plateau fracture and appointment has been made with Dr. Zigmund Daniel for recheck tomorrow. - meloxicam (MOBIC) 15 MG tablet; TAKE 1 TABLET(15 MG) BY MOUTH DAILY  Dispense: 30 tablet; Refill: 1 - DG Knee Complete 4 Views Right; Future  2. Achilles tendinitis of right lower extremity New onset.  Persistent.  Episodic.  This is somewhat of an off-and-on situation depending on her shoes that she wears.  In addition to the meloxicam being refilled she will eventually need to be evaluated perhaps by Dr. Zigmund Daniel for this as well  3. Plantar fasciitis Per above new onset but depending on the shoes she has discomfort in the plantar fascia of the right foot.  There is some relief with meloxicam and this may need to also be reevaluated at the sports medicine level.

## 2021-02-07 NOTE — Telephone Encounter (Signed)
Patient states she was in her grandson's truck- and she went to step out and twisted her R knee- she did hear a pop. Patient states she has pain with walking - no swelling- but a small bruise on inner knee. Appointment scheduled.

## 2021-02-07 NOTE — Telephone Encounter (Signed)
Reason for Disposition  A "snap" or "pop" was heard at the time of injury  Answer Assessment - Initial Assessment Questions 1. MECHANISM: "How did the injury happen?" (e.g., twisting injury, direct blow)      Stepped out of truck- felt pop 2. ONSET: "When did the injury happen?" (Minutes or hours ago)      Yesterday-9:30 pm 3. LOCATION: "Where is the injury located?"      R knee 4. APPEARANCE of INJURY: "What does the injury look like?"      Bruise on side- inside of knee 5. SEVERITY: "Can you put weight on that leg?" "Can you walk?"      Yes- painful 6. SIZE: For cuts, bruises, or swelling, ask: "How large is it?" (e.g., inches or centimeters;  entire joint)      quarter 7. PAIN: "Is there pain?" If Yes, ask: "How bad is the pain?"  "What does it keep you from doing?" (e.g., Scale 1-10; or mild, moderate, severe)   -  NONE: (0): no pain   -  MILD (1-3): doesn't interfere with normal activities    -  MODERATE (4-7): interferes with normal activities (e.g., work or school) or awakens from sleep, limping    -  SEVERE (8-10): excruciating pain, unable to do any normal activities, unable to walk     7-moderate 8. TETANUS: For any breaks in the skin, ask: "When was the last tetanus booster?"     na 9. OTHER SYMPTOMS: "Do you have any other symptoms?"  (e.g., "pop" when knee injured, swelling, locking, buckling)      Patient did hear pop 10. PREGNANCY: "Is there any chance you are pregnant?" "When was your last menstrual period?"       na  Protocols used: Knee Injury-A-AH

## 2021-02-08 ENCOUNTER — Ambulatory Visit (INDEPENDENT_AMBULATORY_CARE_PROVIDER_SITE_OTHER): Payer: Self-pay | Admitting: Family Medicine

## 2021-02-08 ENCOUNTER — Encounter: Payer: Self-pay | Admitting: Family Medicine

## 2021-02-08 VITALS — BP 108/74 | HR 86 | Ht 65.0 in | Wt 183.0 lb

## 2021-02-08 DIAGNOSIS — M2391 Unspecified internal derangement of right knee: Secondary | ICD-10-CM | POA: Insufficient documentation

## 2021-02-08 DIAGNOSIS — M1711 Unilateral primary osteoarthritis, right knee: Secondary | ICD-10-CM | POA: Insufficient documentation

## 2021-02-08 NOTE — Progress Notes (Signed)
Primary Care / Sports Medicine Office Visit  Patient Information:  Patient ID: Debbie Harrington, female DOB: 1965/08/09 Age: 55 y.o. MRN: [id number]   Debbie Harrington is a pleasant 55 y.o. female presenting with the following:  Chief Complaint  Patient presents with   New Patient (Initial Visit)   Knee Injury    Patient Active Problem List   Diagnosis Date Noted   Primary osteoarthritis of right knee 02/08/2021   Internal derangement of right knee 02/08/2021   Right foot pain 07/02/2013   Reflux 07/02/2013    Vitals:   02/08/21 0910  BP: 108/74  Pulse: 86  SpO2: 98%   Vitals:   02/08/21 0910  Weight: 183 lb (83 kg)  Height: 5\' 5"  (1.651 m)   Body mass index is 30.45 kg/m.  DG Knee Complete 4 Views Right  Result Date: 02/07/2021 CLINICAL DATA:  Medial knee pain after a fall. EXAM: RIGHT KNEE - COMPLETE 4+ VIEW COMPARISON:  None. FINDINGS: No evidence of fracture, dislocation, or joint effusion. No evidence of arthropathy or other focal bone abnormality. Soft tissues are unremarkable. IMPRESSION: Negative. Electronically Signed   By: 02/09/2021 M.D.   On: 02/07/2021 12:27     Independent interpretation of notes and tests performed by another provider:   Independent interpretation of right knee x-rays obtained 02/07/2021 reveals mild medial tibiofemoral joint space narrowing, lateral view reveals suprapatellar trace effusion, no acute osseous process noted.  Procedures performed:   None  Pertinent History, Exam, Impression, and Recommendations:   Primary osteoarthritis of right knee 55 year old patient with acute and traumatic onset of right anteromedial knee pain from 02/06/2021.  At that time she describes a descending from a large truck that was high off the ground, came down to her foot, this was planted, describes near buckling episode with questionable torsion.  She was able to continue to bear weight though with severe pain, did not have direct impact  to the knee from the ground or truck.  She did note mild swelling, no ecchymosis, no mechanical catching or locking, sharper and short-lived pain with motion after period of immobility.  She gives questionable history of chronicity with comorbid posterolateral thigh concerns.  She did see her primary care provider Dr. 02/08/2021 on 02/07/2021 who advised regular icing, meloxicam, and relative rest.  Since that time she has noted mild improvement.  Physical examination today reveals no abnormalities to inspection, no effusion, range of motion from 0 to 130 degrees, painless, mild tenderness at the inferomedial patellar facet and maximally at the medial joint line anteriorly, positive McMurray's otherwise no laxity and provocative testing otherwise benign.  Given her acute and traumatic onset, radiographic and clinical findings, concern for medial tibiofemoral arthralgia, exacerbation of her underlying mild osteoarthritis, and concerned over medial meniscus pathology.  Fortunately she has no red flag symptoms.  I have before medication management with advised to continue meloxicam x2 weeks then transition to as needed dosing, initiation of hinged knee brace during all times of weightbearing, and close follow-up in 4 weeks.  If symptoms persist at follow-up without adequate improvement, and consider intra-articular injection and/or advanced imaging.  If improved, wean from brace, medications, start home-based rehab.  Internal derangement of right knee See additional assessment(s) for plan details.   Orders & Medications No orders of the defined types were placed in this encounter.  No orders of the defined types were placed in this encounter.    Return in about 4 weeks (around  03/08/2021).     Jerrol Banana, MD   Primary Care Sports Medicine [facility]

## 2021-02-08 NOTE — Assessment & Plan Note (Signed)
See additional assessment(s) for plan details. 

## 2021-02-08 NOTE — Patient Instructions (Signed)
-   Dose meloxicam once daily with food x2 weeks - After 2 next dose once daily on an as-needed basis for knee pain - Continue ice on an as-needed basis at 20-minute intervals - Use hinged knee brace at all times while you are on your feet (okay to remove for periods of prolonged rest, bathing, sleeping) - Return for follow-up in 4 weeks, contact us for any questions between now and then

## 2021-02-08 NOTE — Assessment & Plan Note (Signed)
55 year old patient with acute and traumatic onset of right anteromedial knee pain from 02/06/2021.  At that time she describes a descending from a large truck that was high off the ground, came down to her foot, this was planted, describes near buckling episode with questionable torsion.  She was able to continue to bear weight though with severe pain, did not have direct impact to the knee from the ground or truck.  She did note mild swelling, no ecchymosis, no mechanical catching or locking, sharper and short-lived pain with motion after period of immobility.  She gives questionable history of chronicity with comorbid posterolateral thigh concerns.  She did see her primary care provider Dr. Yetta Barre on 02/07/2021 who advised regular icing, meloxicam, and relative rest.  Since that time she has noted mild improvement.  Physical examination today reveals no abnormalities to inspection, no effusion, range of motion from 0 to 130 degrees, painless, mild tenderness at the inferomedial patellar facet and maximally at the medial joint line anteriorly, positive McMurray's otherwise no laxity and provocative testing otherwise benign.  Given her acute and traumatic onset, radiographic and clinical findings, concern for medial tibiofemoral arthralgia, exacerbation of her underlying mild osteoarthritis, and concerned over medial meniscus pathology.  Fortunately she has no red flag symptoms.  I have before medication management with advised to continue meloxicam x2 weeks then transition to as needed dosing, initiation of hinged knee brace during all times of weightbearing, and close follow-up in 4 weeks.  If symptoms persist at follow-up without adequate improvement, and consider intra-articular injection and/or advanced imaging.  If improved, wean from brace, medications, start home-based rehab.

## 2021-03-08 ENCOUNTER — Ambulatory Visit (INDEPENDENT_AMBULATORY_CARE_PROVIDER_SITE_OTHER): Payer: Self-pay | Admitting: Family Medicine

## 2021-03-08 ENCOUNTER — Encounter: Payer: Self-pay | Admitting: Family Medicine

## 2021-03-08 ENCOUNTER — Other Ambulatory Visit: Payer: Self-pay

## 2021-03-08 ENCOUNTER — Inpatient Hospital Stay (INDEPENDENT_AMBULATORY_CARE_PROVIDER_SITE_OTHER): Payer: Self-pay | Admitting: Radiology

## 2021-03-08 VITALS — BP 118/72 | HR 95 | Ht 65.0 in | Wt 184.0 lb

## 2021-03-08 DIAGNOSIS — M1711 Unilateral primary osteoarthritis, right knee: Secondary | ICD-10-CM

## 2021-03-08 DIAGNOSIS — M2391 Unspecified internal derangement of right knee: Secondary | ICD-10-CM

## 2021-03-08 DIAGNOSIS — M25561 Pain in right knee: Secondary | ICD-10-CM

## 2021-03-08 MED ORDER — MELOXICAM 15 MG PO TABS: 15.0000 mg | ORAL_TABLET | Freq: Every day | ORAL | 0 refills | Status: DC | PRN

## 2021-03-08 MED ORDER — TRIAMCINOLONE ACETONIDE 40 MG/ML IJ SUSP
40.0000 mg | Freq: Once | INTRAMUSCULAR | Status: AC
  Administered 2021-03-08: 40 mg

## 2021-03-08 NOTE — Progress Notes (Signed)
Primary Care / Sports Medicine Office Visit  Patient Information:  Patient ID: Debbie Harrington, female DOB: [birth year] Age: 55 y.o. MRN: [id number]   Debbie Harrington is a pleasant 55 y.o. female presenting with the following:  Chief Complaint  Patient presents with   Internal derangement of right knee    Wearing brace daily and getting knee pain relief, but having pain in right thigh now; taking Tylenol nightly and meloxicam daily    Patient Active Problem List   Diagnosis Date Noted   Primary osteoarthritis of right knee 02/08/2021   Internal derangement of right knee 02/08/2021   Right foot pain 07/02/2013   Reflux 07/02/2013    Vitals:   03/08/21 0951  BP: 118/72  Pulse: 95  SpO2: 97%   Vitals:   03/08/21 0951  Weight: 184 lb (83.5 kg)  Height: 5\' 5"  (1.651 m)   Body mass index is 30.62 kg/m.  DG Knee Complete 4 Views Right  Result Date: 02/07/2021 CLINICAL DATA:  Medial knee pain after a fall. EXAM: RIGHT KNEE - COMPLETE 4+ VIEW COMPARISON:  None. FINDINGS: No evidence of fracture, dislocation, or joint effusion. No evidence of arthropathy or other focal bone abnormality. Soft tissues are unremarkable. IMPRESSION: Negative. Electronically Signed   By: 02/09/2021 M.D.   On: 02/07/2021 12:27     Independent interpretation of notes and tests performed by another provider:   None  Procedures performed:   Procedure:  Injection of right anteromedial knee intra-articularly under ultrasound guidance. Ultrasound guidance utilized for out of plane approach to the right anteromedial knee, joint space was appropriately visualized Samsung HS60 device utilized with permanent recording / reporting. Consent obtained and verified. Skin prepped in a sterile fashion. Ethyl chloride spray for topical local analgesia.  Completed without difficulty and tolerated well. Medication: triamcinolone acetonide 40 mg/mL suspension for injection 1 mL total and 2 mL lidocaine 1%  without epinephrine utilized for needle placement anesthetic Advised to contact for fevers/chills, erythema, induration, drainage, or persistent bleeding.   Pertinent History, Exam, Impression, and Recommendations:   Primary osteoarthritis of right knee Patient presents for follow-up to right knee arthralgia in the setting of osteoarthritis and acute worsening following acute loading to the right lower extremity.  She has been compliant with brace usage and meloxicam and citing improvement while in the brace, tolerating meloxicam well.  That being said, her symptoms have persisted while she is outside of the brace, denies any significant worsening but has noted progressive mid thigh pain where the brace comes into contact with her leg.  Physical examination reveals no effusion, range of motion passively is full and painless, she does have residual focal tenderness at the medial joint line, no laxity noted, provocative testing benign.  Given her persistent symptomatology, treatments to date, we did discuss additional strategies and she did elect to proceed with ultrasound-guided intra-articular right knee corticosteroid injection.  I have advised her on postinjection care, to wean from brace over the next 1 week, start home exercises, and medication management was performed involving a change in dose and frequency of the meloxicam.  Patient was advised to contact her office if symptoms persist or she finds an inability to wean from brace, she can otherwise follow-up as needed.  If issues noted, formal PT, medication management, advanced imaging to be considered.  Internal derangement of right knee See additional assessment(s) for plan details.   Orders & Medications Meds ordered this encounter  Medications   meloxicam (  MOBIC) 15 MG tablet    Sig: Take 1 tablet (15 mg total) by mouth daily as needed for pain. TAKE 1 TABLET(15 MG) BY MOUTH DAILY    Dispense:  30 tablet    Refill:  0    triamcinolone acetonide (KENALOG-40) injection 40 mg   Orders Placed This Encounter  Procedures   [place] LIMITED JOINT SPACE STRUCTURES LOW RIGHT     Return if symptoms worsen or fail to improve.     Jerrol Banana, MD   Primary Care Sports Medicine [facility]

## 2021-03-08 NOTE — Patient Instructions (Addendum)
You have just been given a cortisone injection to reduce pain and inflammation. After the injection you may notice immediate relief of pain as a result of the Lidocaine. It is important to rest the area of the injection for 24 to 48 hours after the injection. There is a possibility of some temporary increased discomfort and swelling for up to 72 hours until the cortisone begins to work. If you do have pain, simply rest the joint and use ice. If you can tolerate over the counter medications, you can try Tylenol, Aleve, or Advil for added relief per package instructions. - Perform home exercises - Dose meloxicam as-needed daily - Wean from brace over the next 1 week - Contact for any knee symptoms lingering past 1 month - Otherwise, follow-up as-needed

## 2021-03-08 NOTE — Assessment & Plan Note (Signed)
Patient presents for follow-up to right knee arthralgia in the setting of osteoarthritis and acute worsening following acute loading to the right lower extremity.  She has been compliant with brace usage and meloxicam and citing improvement while in the brace, tolerating meloxicam well.  That being said, her symptoms have persisted while she is outside of the brace, denies any significant worsening but has noted progressive mid thigh pain where the brace comes into contact with her leg.  Physical examination reveals no effusion, range of motion passively is full and painless, she does have residual focal tenderness at the medial joint line, no laxity noted, provocative testing benign.  Given her persistent symptomatology, treatments to date, we did discuss additional strategies and she did elect to proceed with ultrasound-guided intra-articular right knee corticosteroid injection.  I have advised her on postinjection care, to wean from brace over the next 1 week, start home exercises, and medication management was performed involving a change in dose and frequency of the meloxicam.  Patient was advised to contact her office if symptoms persist or she finds an inability to wean from brace, she can otherwise follow-up as needed.  If issues noted, formal PT, medication management, advanced imaging to be considered.

## 2021-03-08 NOTE — Assessment & Plan Note (Signed)
See additional assessment(s) for plan details. 

## 2021-04-10 ENCOUNTER — Other Ambulatory Visit: Payer: Self-pay | Admitting: Family Medicine

## 2021-04-10 DIAGNOSIS — M25561 Pain in right knee: Secondary | ICD-10-CM

## 2021-04-10 NOTE — Telephone Encounter (Signed)
Requested medications are due for refill today.  yes  Requested medications are on the active medications list.  yes  Last refill. 03/08/2021  Future visit scheduled.   no  Notes to clinic.  Failed protocol due to missing labs.    Requested Prescriptions  Pending Prescriptions Disp Refills   meloxicam (MOBIC) 15 MG tablet [Pharmacy Med Name: MELOXICAM 15MG  TABLETS] 30 tablet 0    Sig: TAKE 1 TABLET(15 MG) BY MOUTH DAILY AS NEEDED FOR PAIN     Analgesics:  COX2 Inhibitors Failed - 04/10/2021  3:36 AM      Failed - HGB in normal range and within 360 days    No results found for: HGB, HGBKUC, HGBPOCKUC, HGBOTHER, TOTHGB, HGBPLASMA        Failed - Cr in normal range and within 360 days    No results found for: CREATININE, LABCREAU, LABCREA, POCCRE        Passed - Patient is not pregnant      Passed - Valid encounter within last 12 months    Recent Outpatient Visits           1 month ago Primary osteoarthritis of right knee   [facility] 04/12/2021, MD   2 months ago Internal derangement of right knee   [facility] Jerrol Banana, MD   2 months ago Acute pain of right knee   [facility], MD   1 year ago Achilles tendinitis of right lower extremity   [facility] Duanne Limerick, MD   1 year ago Establishing care with new doctor, encounter for   [facility], MD

## 2021-04-10 NOTE — Telephone Encounter (Signed)
Spoke with patient about medication refill, she mention she will call back to set up an appointment later.

## 2021-05-22 ENCOUNTER — Encounter: Payer: Self-pay | Admitting: Family Medicine

## 2021-05-22 ENCOUNTER — Ambulatory Visit
Admission: RE | Admit: 2021-05-22 | Discharge: 2021-05-22 | Disposition: A | Discharge status: Home / Self Care | Payer: Self-pay | Attending: Family Medicine | Admitting: Family Medicine

## 2021-05-22 ENCOUNTER — Other Ambulatory Visit: Payer: Self-pay

## 2021-05-22 ENCOUNTER — Ambulatory Visit (INDEPENDENT_AMBULATORY_CARE_PROVIDER_SITE_OTHER): Payer: Self-pay | Admitting: Family Medicine

## 2021-05-22 ENCOUNTER — Ambulatory Visit
Admission: RE | Admit: 2021-05-22 | Discharge: 2021-05-22 | Disposition: A | Discharge status: Home / Self Care | Payer: Self-pay | Source: Ambulatory Visit | Attending: Family Medicine | Admitting: Family Medicine

## 2021-05-22 VITALS — BP 120/80 | HR 88 | Ht 65.0 in | Wt 173.0 lb

## 2021-05-22 DIAGNOSIS — S76111A Strain of right quadriceps muscle, fascia and tendon, initial encounter: Secondary | ICD-10-CM

## 2021-05-22 MED ORDER — PREDNISONE 10 MG PO TABS: 10.0000 mg | ORAL_TABLET | Freq: Every day | ORAL | 0 refills | Status: DC

## 2021-05-22 NOTE — Progress Notes (Signed)
Date:  05/22/2021   Name:  Debbie Harrington   DOB:  21-Aug-1965   MRN:  [id number]   Chief Complaint: thigh pain (R) thigh pain- taking meloxicam not helping.)  Leg Pain  The incident occurred more than 1 week ago. Incident location: in knee brace. There was no injury mechanism. The pain is present in the right thigh. The quality of the pain is described as aching. The pain is at a severity of 7/10. The pain is moderate. The pain has been Constant since onset. Associated symptoms include an inability to bear weight. Pertinent negatives include no loss of motion, loss of sensation, muscle weakness, numbness or tingling. Associated symptoms comments: Pain with flexion/adduction. The symptoms are aggravated by movement, palpation and weight bearing. She has tried NSAIDs for the symptoms. The treatment provided no relief.   No results found for: NA, K, CO2, GLUCOSE, BUN, CREATININE, CALCIUM, EGFR, GFRNONAA, GLUCOSE No results found for: CHOL, HDL, LDLCALC, LDLDIRECT, TRIG, CHOLHDL No results found for: TSH No results found for: HGBA1C No results found for: WBC, HGB, HCT, MCV, PLT No results found for: ALT, AST, GGT, ALKPHOS, BILITOT No results found for: 25OHVITD2, 25OHVITD3, VD25OH   Review of Systems  Musculoskeletal:  Positive for myalgias. Negative for arthralgias and joint swelling.  Neurological:  Negative for tingling and numbness.   Patient Active Problem List   Diagnosis Date Noted   Primary osteoarthritis of right knee 02/08/2021   Internal derangement of right knee 02/08/2021   Right foot pain 07/02/2013   Reflux 07/02/2013    No Known Allergies  Past Surgical History:  Procedure Laterality Date   ABDOMINAL HYSTERECTOMY     arm fracture     COLONOSCOPY WITH PROPOFOL N/A 01/17/2020   Procedure: COLONOSCOPY WITH PROPOFOL;  Surgeon: Lesly Rubenstein, MD;  Location: [facility] ENDOSCOPY;  Service: Endoscopy;  Laterality: N/A;   FACIAL RECONSTRUCTION SURGERY     FRACTURE  SURGERY      Social History   Tobacco Use   Smoking status: Every Day    Packs/day: 1.00    Types: Cigarettes   Smokeless tobacco: Never  Vaping Use   Vaping Use: Never used  Substance Use Topics   Alcohol use: Yes   Drug use: Never     Medication list has been reviewed and updated.  Current Meds  Medication Sig   acetaminophen (TYLENOL) 500 MG tablet Take 1,000 mg by mouth every 6 (six) hours as needed.   meloxicam (MOBIC) 15 MG tablet Take 1 tablet (15 mg total) by mouth daily as needed for pain. TAKE 1 TABLET(15 MG) BY MOUTH DAILY    PHQ 2/9 Scores 05/22/2021 03/08/2021 02/08/2021 02/07/2021  PHQ - 2 Score 0 0 0 0  PHQ- 9 Score 0 0 0 0    GAD 7 : Generalized Anxiety Score 05/22/2021 03/08/2021 02/08/2021 02/07/2021  Nervous, Anxious, on Edge 0 0 0 0  Control/stop worrying 0 0 0 0  Worry too much - different things 0 0 0 0  Trouble relaxing 0 0 0 0  Restless 0 0 0 0  Easily annoyed or irritable 0 0 0 0  Afraid - awful might happen 0 0 0 0  Total GAD 7 Score 0 0 0 0  Anxiety Difficulty Not difficult at all Not difficult at all Not difficult at all Not difficult at all    BP Readings from Last 3 Encounters:  05/22/21 120/80  03/08/21 118/72  02/08/21 108/74    Physical  Exam Vitals and nursing note reviewed.  Constitutional:      Appearance: She is well-developed.  HENT:     Head: Normocephalic.     Right Ear: External ear normal.     Left Ear: External ear normal.  Eyes:     General: Lids are everted, no foreign bodies appreciated. No scleral icterus.       Left eye: No foreign body or hordeolum.     Conjunctiva/sclera: Conjunctivae normal.     Right eye: Right conjunctiva is not injected.     Left eye: Left conjunctiva is not injected.     Pupils: Pupils are equal, round, and reactive to light.  Neck:     Thyroid: No thyromegaly.     Vascular: No JVD.     Trachea: No tracheal deviation.  Cardiovascular:     Rate and Rhythm: Normal rate and regular  rhythm.     Heart sounds: Normal heart sounds. No murmur heard.   No friction rub. No gallop.  Pulmonary:     Effort: Pulmonary effort is normal. No respiratory distress.     Breath sounds: Normal breath sounds. No wheezing or rales.  Abdominal:     General: Bowel sounds are normal.     Palpations: Abdomen is soft. There is no mass.     Tenderness: There is no abdominal tenderness. There is no guarding or rebound.  Musculoskeletal:        General: Normal range of motion.     Cervical back: Normal range of motion and neck supple.     Left upper leg: Tenderness present.     Comments: Pain with flexion of hip/int/ext rotation and adduction  Lymphadenopathy:     Cervical: No cervical adenopathy.  Skin:    General: Skin is warm.     Findings: No rash.  Neurological:     Mental Status: She is alert and oriented to person, place, and time.     Cranial Nerves: No cranial nerve deficit.     Deep Tendon Reflexes: Reflexes normal.  Psychiatric:        Mood and Affect: Mood is not anxious or depressed.    Wt Readings from Last 3 Encounters:  05/22/21 173 lb (78.5 kg)  03/08/21 184 lb (83.5 kg)  02/08/21 183 lb (83 kg)    BP 120/80    Pulse 88    Ht 5' 5"  (1.651 m)    Wt 173 lb (78.5 kg)    BMI 28.79 kg/m   Assessment and Plan:  1. Quadriceps muscle strain, right, initial encounter New onset.  Onset 2 weeks ago.  Not associated with injury or abnormal positioning.  Recently treated for knee pain with the brace that was removed in December so there is been a delay in time.  Pain is in the right inguinal and there is tenderness to palpation and pain in the distribution of adductor quadricep and along the lateral aspect as well.  Pain is with internal/external rotation and flexion of hip and abduction.  In addition the meloxicam we will initiate a 40 mg taper over 12 days of prednisone.  Patient will return for recheck in 1 week with Dr. Zigmund Daniel. - DG HIP UNILAT WITH PELVIS 2-3 VIEWS RIGHT;  Future - predniSONE (DELTASONE) 10 MG tablet; Take 1 tablet (10 mg total) by mouth daily with breakfast. Taper 4,4,4,3,3,3,2,2,2,1,1,1  Dispense: 30 tablet; Refill: 0

## 2021-05-24 ENCOUNTER — Ambulatory Visit: Payer: Self-pay | Admitting: Family Medicine

## 2021-05-30 ENCOUNTER — Ambulatory Visit: Payer: Self-pay | Admitting: Family Medicine

## 2021-05-31 ENCOUNTER — Other Ambulatory Visit: Payer: Self-pay

## 2021-05-31 ENCOUNTER — Ambulatory Visit (INDEPENDENT_AMBULATORY_CARE_PROVIDER_SITE_OTHER): Payer: Self-pay | Admitting: Family Medicine

## 2021-05-31 ENCOUNTER — Encounter: Payer: Self-pay | Admitting: Family Medicine

## 2021-05-31 VITALS — BP 122/84 | HR 91 | Ht 65.0 in | Wt 174.0 lb

## 2021-05-31 DIAGNOSIS — M1611 Unilateral primary osteoarthritis, right hip: Secondary | ICD-10-CM

## 2021-05-31 DIAGNOSIS — M1711 Unilateral primary osteoarthritis, right knee: Secondary | ICD-10-CM

## 2021-05-31 MED ORDER — DICLOFENAC SODIUM 75 MG PO TBEC: 75.0000 mg | DELAYED_RELEASE_TABLET | Freq: Two times a day (BID) | ORAL | 0 refills | Status: DC

## 2021-05-31 NOTE — Patient Instructions (Signed)
-   Finish out remaining prednisone ?- Once prednisone complete, start diclofenac twice daily scheduled (take with food) ?- Return for follow-up as scheduled for reevaluation ?- Contact us for any questions between now and then ?

## 2021-06-01 DIAGNOSIS — M1611 Unilateral primary osteoarthritis, right hip: Secondary | ICD-10-CM | POA: Insufficient documentation

## 2021-06-01 NOTE — Progress Notes (Signed)
?  ? ?Primary Care / Sports Medicine Office Visit ? ?Patient Information:  ?Patient ID: Debbie Harrington, female DOB: [birth year] Age: 56 y.o. MRN: [id number]  ? ?Debbie Harrington is a pleasant 56 y.o. female presenting with the following: ? ?Chief Complaint  ?Patient presents with  ? Hip Pain  ?  Improving, but still gets pain in thigh on "inside and outside"  ? Thigh pain  ? ? ?Vitals:  ? 05/31/21 0939  ?BP: 122/84  ?Pulse: 91  ?SpO2: 98%  ? ?Vitals:  ? 05/31/21 0939  ?Weight: 174 lb (78.9 kg)  ?Height: 5\' 5"  (1.651 m)  ? ?Body mass index is 28.96 kg/m?. ? ?DG HIP UNILAT WITH PELVIS 2-3 VIEWS RIGHT ? ?Result Date: 05/23/2021 ?CLINICAL DATA:  Right leg, hip pain EXAM: DG HIP (WITH OR WITHOUT PELVIS) 2-3V RIGHT COMPARISON:  None. FINDINGS: Normal alignment. No acute fracture or dislocation. Moderate bilateral degenerative hip arthritis, right greater than left, with asymmetric joint space narrowing and osteophyte formation. Facet arthrosis is seen within the lower lumbar spine, not well profiled on this exam. Soft tissues are unremarkable. IMPRESSION: Bilateral degenerative hip arthritis, right greater than left. Electronically Signed   By: 07/23/2021 M.D.   On: 05/23/2021 19:54    ? ?Independent interpretation of notes and tests performed by another provider:  ? ?Independent interpretation of right hip x-ray with AP pelvis reveals right greater than left moderate to severe hip osteoarthritis, femoral head deformation consistent with degenerative changes, limited visualization of lumbosacral spine does reveal asymmetric spondyloarthropathy, no acute osseous processes noted ? ?Procedures performed:  ? ?None ? ?Pertinent History, Exam, Impression, and Recommendations:  ? ?Primary osteoarthritis of right hip ?Patient with chronic right hip pain, has noted this to be a progressively worsening issue as right knee had initially demonstrated improvement.  As of late both right hip and right knee are symptomatic.  She feels  radiation of pain between these 2 areas, aggravated by weightbearing, no paresthesias, no significant back pain.  Her x-rays demonstrate right greater than left hip osteoarthritis, incidentally noted asymmetric lower lumbar spondylosis from the limited hip/pelvis views. ? ?I discussed the interplay between right hip and right knee pain in the setting of osteoarthritis of both sites, treatment strategies, and next steps.  Patient is amenable to a transition to diclofenac 75 mg twice daily, we will schedule close follow-up for reassessment.  If symptoms still present, anticipate ultrasound-guided corticosteroid injections. ? ?Chronic condition, symptomatic, independent interpretation x-rays, Rx management ? ?Primary osteoarthritis of right knee ?Patient presents for follow-up to right hip and knee pain, knee previously evaluated for osteoarthritis related arthralgia.  At her last visit on 03/08/2021 she did receive an intra-articular corticosteroid injection which provided excellent symptom control until the past few weeks. ? ?Her symptoms today are again consistent with underlying osteoarthritis, most likely exacerbated by comorbid right hip osteoarthritis noted on x-rays today.  Plan for transition to diclofenac 75 mg twice daily, close follow-up, low threshold to repeat corticosteroid injections if indicated. ? ?Chronic condition, symptomatic, Rx management  ? ?Orders & Medications ?Meds ordered this encounter  ?Medications  ? diclofenac (VOLTAREN) 75 MG EC tablet  ?  Sig: Take 1 tablet (75 mg total) by mouth 2 (two) times daily.  ?  Dispense:  30 tablet  ?  Refill:  0  ? ?No orders of the defined types were placed in this encounter. ?  ? ?Return in about 6 days (around 06/06/2021) for Follow-up right knee and right  hip.  ?  ? ?Jerrol Banana, MD ? ? Primary Care Sports Medicine ?[facility] ?[facility]  ? ?

## 2021-06-01 NOTE — Assessment & Plan Note (Signed)
Patient with chronic right hip pain, has noted this to be a progressively worsening issue as right knee had initially demonstrated improvement.  As of late both right hip and right knee are symptomatic.  She feels radiation of pain between these 2 areas, aggravated by weightbearing, no paresthesias, no significant back pain.  Her x-rays demonstrate right greater than left hip osteoarthritis, incidentally noted asymmetric lower lumbar spondylosis from the limited hip/pelvis views. ? ?I discussed the interplay between right hip and right knee pain in the setting of osteoarthritis of both sites, treatment strategies, and next steps.  Patient is amenable to a transition to diclofenac 75 mg twice daily, we will schedule close follow-up for reassessment.  If symptoms still present, anticipate ultrasound-guided corticosteroid injections. ? ?Chronic condition, symptomatic, independent interpretation x-rays, Rx management ?

## 2021-06-01 NOTE — Assessment & Plan Note (Signed)
Patient presents for follow-up to right hip and knee pain, knee previously evaluated for osteoarthritis related arthralgia.  At her last visit on 03/08/2021 she did receive an intra-articular corticosteroid injection which provided excellent symptom control until the past few weeks. ? ?Her symptoms today are again consistent with underlying osteoarthritis, most likely exacerbated by comorbid right hip osteoarthritis noted on x-rays today.  Plan for transition to diclofenac 75 mg twice daily, close follow-up, low threshold to repeat corticosteroid injections if indicated. ? ?Chronic condition, symptomatic, Rx management ?

## 2021-06-11 ENCOUNTER — Ambulatory Visit: Payer: Medicaid Other | Admitting: Family Medicine

## 2021-06-14 ENCOUNTER — Ambulatory Visit: Payer: Medicaid Other | Admitting: Family Medicine

## 2021-06-18 ENCOUNTER — Other Ambulatory Visit: Payer: Self-pay | Admitting: Family Medicine

## 2021-06-18 DIAGNOSIS — M1611 Unilateral primary osteoarthritis, right hip: Secondary | ICD-10-CM

## 2021-06-18 DIAGNOSIS — M1711 Unilateral primary osteoarthritis, right knee: Secondary | ICD-10-CM

## 2021-06-19 ENCOUNTER — Inpatient Hospital Stay (INDEPENDENT_AMBULATORY_CARE_PROVIDER_SITE_OTHER): Payer: Self-pay | Admitting: Radiology

## 2021-06-19 ENCOUNTER — Other Ambulatory Visit: Payer: Self-pay

## 2021-06-19 ENCOUNTER — Ambulatory Visit (INDEPENDENT_AMBULATORY_CARE_PROVIDER_SITE_OTHER): Payer: Self-pay | Admitting: Family Medicine

## 2021-06-19 ENCOUNTER — Encounter: Payer: Self-pay | Admitting: Family Medicine

## 2021-06-19 VITALS — BP 122/84 | HR 86 | Ht 65.0 in | Wt 183.0 lb

## 2021-06-19 DIAGNOSIS — M25551 Pain in right hip: Secondary | ICD-10-CM

## 2021-06-19 DIAGNOSIS — M1611 Unilateral primary osteoarthritis, right hip: Secondary | ICD-10-CM

## 2021-06-19 DIAGNOSIS — M7051 Other bursitis of knee, right knee: Secondary | ICD-10-CM

## 2021-06-19 DIAGNOSIS — M1711 Unilateral primary osteoarthritis, right knee: Secondary | ICD-10-CM

## 2021-06-19 MED ORDER — TRIAMCINOLONE ACETONIDE 40 MG/ML IJ SUSP
160.0000 mg | Freq: Once | INTRAMUSCULAR | Status: AC
  Administered 2021-06-19: 160 mg via INTRAMUSCULAR

## 2021-06-19 MED ORDER — DICLOFENAC SODIUM 75 MG PO TBEC: 75.0000 mg | DELAYED_RELEASE_TABLET | Freq: Two times a day (BID) | ORAL | 0 refills | Status: DC | PRN

## 2021-06-19 NOTE — Assessment & Plan Note (Signed)
Patient presents for follow-up to chronic right hip pain in the setting of right hip osteoarthritis and generalized deconditioning.  At her last visit on 06/01/2021 she was advised scheduled diclofenac, activity modification, close follow-up. ? ?Despite these interventions, she has noted mild improvement though is still symptomatic, has noted pain shift to her anterior thigh, medial thigh, lateral hip.  Examination does show persistent focality of symptoms to her right hip joint, secondarily at the greater trochanter, IT band, and gluteus medius tendons at the insertion. ? ?As such, she did elect to proceed with ultrasound-guided right hip intra-articular cortisone injection, post care reviewed, she will transition to as needed diclofenac, start formal physical therapy, and can return for follow-up on an as-needed basis particular for symptoms that persist despite PT. ? ?Chronic condition, symptomatic, management ?

## 2021-06-19 NOTE — Assessment & Plan Note (Signed)
This can be considered a secondary/compensatory finding in the setting of right knee osteoarthritis that continues to be symptomatic.  She did elect to proceed with ultrasound-guided cortisone injection of the right pes anserine bursa, post care reviewed.  Transition to as needed diclofenac advised as well as formal physical therapy.  She can follow-up on as-needed basis if symptoms persist after PT. ?

## 2021-06-19 NOTE — Patient Instructions (Signed)
You have just been given a cortisone injection to reduce pain and inflammation. After the injection you may notice immediate relief of pain as a result of the Lidocaine. It is important to rest the area of the injection for 24 to 48 hours after the injection. There is a possibility of some temporary increased discomfort and swelling for up to 72 hours until the cortisone begins to work. If you do have pain, simply rest the joint and use ice. If you can tolerate over the counter medications, you can try Tylenol, Aleve, or Advil for added relief per package instructions. ?- As above, relative rest x2 days then gradual return to normal activity ?- Continue diclofenac up to twice a day on an as-needed basis ?- Referral coordinator will contact you in regards to physical therapy scheduling ?- Contact us if unable to perform PT due to any barriers, we will send home exercises ?- Follow-up as needed ?

## 2021-06-19 NOTE — Assessment & Plan Note (Signed)
This can be considered a secondary/compensatory condition in the setting of right hip osteoarthritis and generalized deconditioning.  Given her persistent symptoms despite scheduled diclofenac, she did elect to proceed with ultrasound-guided right greater trochanter cortisone injection.  Additionally, have advised to transition to as needed diclofenac, start a formal physical therapy, and she can follow-up on an as-needed basis if symptoms persist beyond PT. ?

## 2021-06-19 NOTE — Progress Notes (Signed)
?  ? ?Primary Care / Sports Medicine Office Visit ? ?Patient Information:  ?Patient ID: Debbie Harrington, female DOB: May 08, 1965 Age: 56 y.o. MRN: [id number]  ? ?Debbie Harrington is a pleasant 56 y.o. female presenting with the following: ? ?Chief Complaint  ?Patient presents with  ? Knee Pain  ?  Right, feels like medication is helping, states that she is feeling more pain radiate up to hip  ? Hip Pain  ?  Right, feels like the medication is helping.   ? ? ?Vitals:  ? 06/19/21 0931  ?BP: 122/84  ?Pulse: 86  ?SpO2: 98%  ? ?Vitals:  ? 06/19/21 0931  ?Weight: 183 lb (83 kg)  ?Height: 5\' 5"  (1.651 m)  ? ?Body mass index is 30.45 kg/m?. ? ?DG HIP UNILAT WITH PELVIS 2-3 VIEWS RIGHT ? ?Result Date: 05/23/2021 ?CLINICAL DATA:  Right leg, hip pain EXAM: DG HIP (WITH OR WITHOUT PELVIS) 2-3V RIGHT COMPARISON:  None. FINDINGS: Normal alignment. No acute fracture or dislocation. Moderate bilateral degenerative hip arthritis, right greater than left, with asymmetric joint space narrowing and osteophyte formation. Facet arthrosis is seen within the lower lumbar spine, not well profiled on this exam. Soft tissues are unremarkable. IMPRESSION: Bilateral degenerative hip arthritis, right greater than left. Electronically Signed   By: Fidela Salisbury M.D.   On: 05/23/2021 19:54    ? ?Independent interpretation of notes and tests performed by another provider:  ? ?None ? ?Procedures performed:  ? ?Procedure:  Injection of the right intra-articular knee under ultrasound guidance. ?Ultrasound guidance utilized for out of plane anteromedial approach no effusion noted ?Samsung HS60 device utilized with permanent recording / reporting. ?Verbal informed consent obtained and verified. ?Skin prepped in a sterile fashion. ?Ethyl chloride for topical local analgesia.  ?Completed without difficulty and tolerated well. ?Medication: triamcinolone acetonide 40 mg/mL suspension for injection 1 mL total and 2 mL lidocaine 1% without epinephrine utilized  for needle placement anesthetic ?Advised to contact for fevers/chills, erythema, induration, drainage, or persistent bleeding. ? ?Procedure:  Injection of right pes anserine bursa under ultrasound guidance. ?Ultrasound guidance utilized for out of plane approach, no tendon abnormality is readily apparent ?Samsung HS60 device utilized with permanent recording / reporting. ?Verbal informed consent obtained and verified. ?Skin prepped in a sterile fashion. ?Ethyl chloride for topical local analgesia.  ?Completed without difficulty and tolerated well. ?Medication: triamcinolone acetonide 40 mg/mL suspension for injection 1 mL total and 2 mL lidocaine 1% without epinephrine utilized for needle placement anesthetic ?Advised to contact for fevers/chills, erythema, induration, drainage, or persistent bleeding. ? ?Procedure:  Injection of right greater trochanter under ultrasound guidance. ?Ultrasound guidance utilized for out of plane approach, no effusion noted ?Samsung HS60 device utilized with permanent recording / reporting. ?Verbal informed consent obtained and verified. ?Skin prepped in a sterile fashion. ?Ethyl chloride for topical local analgesia.  ?Completed without difficulty and tolerated well. ?Medication: triamcinolone acetonide 40 mg/mL suspension for injection 1 mL total and 2 mL lidocaine 1% without epinephrine utilized for needle placement anesthetic ?Advised to contact for fevers/chills, erythema, induration, drainage, or persistent bleeding. ? ?Procedure:  Injection of right intra-articular hip under ultrasound guidance. ?Ultrasound guidance utilized for in-plane approach, no joint effusion visualized ?Samsung HS60 device utilized with permanent recording / reporting. ?Verbal informed consent obtained and verified. ?Skin prepped in a sterile fashion. ?Ethyl chloride for topical local analgesia.  ?Completed without difficulty and tolerated well. ?Medication: triamcinolone acetonide 40 mg/mL suspension for  injection 1 mL total and 2 mL lidocaine  1% without epinephrine utilized for needle placement anesthetic ?Advised to contact for fevers/chills, erythema, induration, drainage, or persistent bleeding. ? ? ?Pertinent History, Exam, Impression, and Recommendations:  ? ?Primary osteoarthritis of right knee ?Patient presents for follow-up to right hip and knee pain, at last visit on 06/01/2021 she was advised a transition diclofenac 75 mg twice daily scheduled, activity modification, and close follow-up. ? ?Patient has endorsed interval subtle improvement though still feels rather symptomatic, has noted new onset pain tracking along her inner thigh/anterior thigh.  Denies any paresthesias, no new injury or change in activities otherwise. ? ?Examination does show focal tenderness about the patellofemoral articulation, medial joint line, pes anserine tendons and bursa tenderness.  Provocative testing otherwise benign. ? ?Her findings are most consistent with continued exacerbation of knee osteoarthritis, secondary/compensatory findings along the pes anserine tendons and bursa.  We reviewed treatment strategies given her treatments to date and she did elect to proceed with ultrasound-guided corticosteroid injections both right knee intra-articularly as well as at the pes anserine bursa.  From a medication management standpoint I have advised a transition from scheduled to as needed diclofenac, additionally formal physical therapy referral has been placed. ? ?Chronic condition, symptomatic, Rx management ? ?Pes anserinus bursitis of right knee ?This can be considered a secondary/compensatory finding in the setting of right knee osteoarthritis that continues to be symptomatic.  She did elect to proceed with ultrasound-guided cortisone injection of the right pes anserine bursa, post care reviewed.  Transition to as needed diclofenac advised as well as formal physical therapy.  She can follow-up on as-needed basis if symptoms  persist after PT. ? ?Greater 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 ?This can be considered a secondary/compensatory condition in the setting of right hip osteoarthritis and generalized deconditioning.  Given her persistent symptoms despite scheduled diclofenac, she did elect to proceed with ultrasound-guided right greater trochanter cortisone injection.  Additionally, have advised to transition to as needed diclofenac, start a formal physical therapy, and she can follow-up on an as-needed basis if symptoms persist beyond PT. ? ?Primary osteoarthritis of right hip ?Patient presents for follow-up to chronic right hip pain in the setting of right hip osteoarthritis and generalized deconditioning.  At her last visit on 06/01/2021 she was advised scheduled diclofenac, activity modification, close follow-up. ? ?Despite these interventions, she has noted mild improvement though is still symptomatic, has noted pain shift to her anterior thigh, medial thigh, lateral hip.  Examination does show persistent focality of symptoms to her right hip joint, secondarily at the greater trochanter, IT band, and gluteus medius tendons at the insertion. ? ?As such, she did elect to proceed with ultrasound-guided right hip intra-articular cortisone injection, post care reviewed, she will transition to as needed diclofenac, start formal physical therapy, and can return for follow-up on an as-needed basis particular for symptoms that persist despite PT. ? ?Chronic condition, symptomatic, management  ? ?Orders & Medications ?Meds ordered this encounter  ?Medications  ? diclofenac (VOLTAREN) 75 MG EC tablet  ?  Sig: Take 1 tablet (75 mg total) by mouth 2 (two) times daily as needed.  ?  Dispense:  60 tablet  ?  Refill:  0  ? triamcinolone acetonide (KENALOG-40) injection 160 mg  ? ?Orders Placed This Encounter  ?Procedures  ? [place] LIMITED JOINT SPACE STRUCTURES LOW RIGHT  ? Ambulatory referral to Physical Therapy  ?  ? ?Return if  symptoms worsen or fail to improve.  ?  ? ?Montel Culver, MD ? ?  Primary Care Sports Medicine ?[facility] ?[place]  ? ?

## 2021-06-19 NOTE — Assessment & Plan Note (Signed)
Patient presents for follow-up to right hip and knee pain, at last visit on 06/01/2021 she was advised a transition diclofenac 75 mg twice daily scheduled, activity modification, and close follow-up. ? ?Patient has endorsed interval subtle improvement though still feels rather symptomatic, has noted new onset pain tracking along her inner thigh/anterior thigh.  Denies any paresthesias, no new injury or change in activities otherwise. ? ?Examination does show focal tenderness about the patellofemoral articulation, medial joint line, pes anserine tendons and bursa tenderness.  Provocative testing otherwise benign. ? ?Her findings are most consistent with continued exacerbation of knee osteoarthritis, secondary/compensatory findings along the pes anserine tendons and bursa.  We reviewed treatment strategies given her treatments to date and she did elect to proceed with ultrasound-guided corticosteroid injections both right knee intra-articularly as well as at the pes anserine bursa.  From a medication management standpoint I have advised a transition from scheduled to as needed diclofenac, additionally formal physical therapy referral has been placed. ? ?Chronic condition, symptomatic, Rx management ?

## 2021-06-20 NOTE — Telephone Encounter (Signed)
Rx 06/19/21 #[id number] sent to pharmacy 06/19/21 ?Requested Prescriptions  ?Pending Prescriptions Disp Refills  ?? diclofenac (VOLTAREN) 75 MG EC tablet [Pharmacy Med Name: DICLOFENAC SODIUM 75MG DR TABLETS] 30 tablet 0  ?  Sig: TAKE 1 TABLET(75 MG) BY MOUTH TWICE DAILY  ?  ? Analgesics:  NSAIDS Failed - 06/18/2021 12:56 PM  ?  ?  Failed - Manual Review: Labs are only required if the patient has taken medication for more than 8 weeks.  ?  ?  Failed - Cr in normal range and within 360 days  ?  No results found for: CREATININE, LABCREAU, LABCREA, POCCRE   ?  ?  Failed - HGB in normal range and within 360 days  ?  No results found for: HGB, HGBKUC, HGBPOCKUC, HGBOTHER, TOTHGB, HGBPLASMA   ?  ?  Failed - PLT in normal range and within 360 days  ?  No results found for: PLT, PLTCOUNTKUC, LABPLAT, POCPLA   ?  ?  Failed - HCT in normal range and within 360 days  ?  No results found for: HCT, HCTKUC, SRHCT   ?  ?  Failed - eGFR is 30 or above and within 360 days  ?  No results found for: GFRAA, GFRNONAA, GFR, EGFR   ?  ?  Passed - Patient is not pregnant  ?  ?  Passed - Valid encounter within last 12 months  ?  Recent Outpatient Visits   ?      ? Yesterday Primary osteoarthritis of right knee  ? [facility] Montel Culver, MD  ? 2 weeks ago Primary osteoarthritis of right knee  ? [place], MD  ? 4 weeks ago Quadriceps muscle strain, right, initial encounter  ? [facility] Juline Patch, MD  ? 3 months ago Primary osteoarthritis of right knee  ? [place], MD  ? 4 months ago Internal derangement of right knee  ? [facility] Montel Culver, MD  ?  ?  ? ?  ?  ?  ? ?

## 2021-06-26 ENCOUNTER — Ambulatory Visit: Payer: Medicaid Other

## 2021-07-13 ENCOUNTER — Ambulatory Visit: Payer: Medicaid Other | Admitting: Family Medicine

## 2021-07-13 ENCOUNTER — Ambulatory Visit: Payer: Self-pay | Admitting: *Deleted

## 2021-07-13 ENCOUNTER — Ambulatory Visit (INDEPENDENT_AMBULATORY_CARE_PROVIDER_SITE_OTHER): Payer: Self-pay | Admitting: Family Medicine

## 2021-07-13 ENCOUNTER — Encounter: Payer: Self-pay | Admitting: Family Medicine

## 2021-07-13 VITALS — BP 120/80 | HR 88 | Ht 65.0 in | Wt 178.2 lb

## 2021-07-13 DIAGNOSIS — M1611 Unilateral primary osteoarthritis, right hip: Secondary | ICD-10-CM

## 2021-07-13 MED ORDER — HYDROCODONE-ACETAMINOPHEN 5-325 MG PO TABS: 1.0000 | ORAL_TABLET | Freq: Four times a day (QID) | ORAL | 0 refills | Status: AC | PRN

## 2021-07-13 MED ORDER — PREDNISONE 50 MG PO TABS: 50.0000 mg | ORAL_TABLET | Freq: Every day | ORAL | 0 refills | Status: DC

## 2021-07-13 MED ORDER — CYCLOBENZAPRINE HCL 10 MG PO TABS: 10.0000 mg | ORAL_TABLET | Freq: Three times a day (TID) | ORAL | 0 refills | Status: DC | PRN

## 2021-07-13 NOTE — Progress Notes (Signed)
?  ? ?  Primary Care / Sports Medicine Office Visit ? ?Patient Information:  ?Patient ID: Debbie Harrington, female DOB: September 05, 1965 Age: 56 y.o. MRN: [id number]  ? ?Debbie Harrington is a pleasant 56 y.o. female presenting with the following: ? ?Chief Complaint  ?Patient presents with  ? Thigh pain  ?  Pt here with C/O right thigh pain since last visit off and on, last night & today was worst in pain. Tried pain patch, heat pad, medication no help ?  ? ? ?Vitals:  ? 07/13/21 1453  ?BP: 120/80  ?Pulse: 88  ?SpO2: 98%  ? ?Vitals:  ? 07/13/21 1453  ?Weight: 178 lb 3.2 oz (80.8 kg)  ?Height: 5\' 5"  (1.651 m)  ? ?Body mass index is 29.65 kg/m?. ? ?  ? ?Independent interpretation of notes and tests performed by another provider:  ? ?None ? ?Procedures performed:  ? ?None ? ?Pertinent History, Exam, Impression, and Recommendations:  ? ?Problem List Items Addressed This Visit   ? ?  ? Musculoskeletal and Integument  ? Primary osteoarthritis of right hip - Primary  ?  Patient returns for follow-up due to severe pain since yesterday, ongoing to today localized to the right medial and lateral hip, denies any overt groin pain, feels that pain has been present mildly on and off since last visit on 06/19/2021 where right hip intra-articular and right greater trochanter cortisone injections were performed.  Additionally right knee injections were performed.  She denies any knee pain, pain radiates from hip to mid thigh, denies any paresthesias, no back pain, pain with sitting or standing. ? ?Examination reveals patient standing, in mild distress secondary to pain, examination reveals pain during hip internal rotation greater than external rotation, nontender directly on the groin, tenderness at the greater trochanteric region, secondarily to the adductors.  Knee examination is benign. ? ?Given the acuity of symptoms plan for stat MRI of the right hip without contrast.  Initial stage of treatment including course of steroids, analgesics,  activity shutdown.  We will contact the patient with results and have encouraged patient to seek emergent medical attention the pain persist despite after mentioned interventions. ? ?  ?  ? Relevant Medications  ? predniSONE (DELTASONE) 50 MG tablet  ? HYDROcodone-acetaminophen (NORCO/VICODIN) 5-325 MG tablet  ? cyclobenzaprine (FLEXERIL) 10 MG tablet  ? Other Relevant Orders  ? MR HIP RIGHT WO CONTRAST  ?  ? ?Orders & Medications ?Meds ordered this encounter  ?Medications  ? predniSONE (DELTASONE) 50 MG tablet  ?  Sig: Take 1 tablet (50 mg total) by mouth daily.  ?  Dispense:  5 tablet  ?  Refill:  0  ? HYDROcodone-acetaminophen (NORCO/VICODIN) 5-325 MG tablet  ?  Sig: Take 1 tablet by mouth every 6 (six) hours as needed for up to 5 days for moderate pain.  ?  Dispense:  20 tablet  ?  Refill:  0  ? cyclobenzaprine (FLEXERIL) 10 MG tablet  ?  Sig: Take 1 tablet (10 mg total) by mouth 3 (three) times daily as needed for muscle spasms.  ?  Dispense:  30 tablet  ?  Refill:  0  ? ?Orders Placed This Encounter  ?Procedures  ? MR HIP RIGHT WO CONTRAST  ?  ? ?No follow-ups on file.  ?  ? ?06/21/2021, MD ? ? Primary Care Sports Medicine ?[facility] ?[facility]  ? ?

## 2021-07-13 NOTE — Assessment & Plan Note (Signed)
Patient returns for follow-up due to severe pain since yesterday, ongoing to today localized to the right medial and lateral hip, denies any overt groin pain, feels that pain has been present mildly on and off since last visit on 06/19/2021 where right hip intra-articular and right greater trochanter cortisone injections were performed.  Additionally right knee injections were performed.  She denies any knee pain, pain radiates from hip to mid thigh, denies any paresthesias, no back pain, pain with sitting or standing. ? ?Examination reveals patient standing, in mild distress secondary to pain, examination reveals pain during hip internal rotation greater than external rotation, nontender directly on the groin, tenderness at the greater trochanteric region, secondarily to the adductors.  Knee examination is benign. ? ?Given the acuity of symptoms plan for stat MRI of the right hip without contrast.  Initial stage of treatment including course of steroids, analgesics, activity shutdown.  We will contact the patient with results and have encouraged patient to seek emergent medical attention the pain persist despite after mentioned interventions. ?

## 2021-07-13 NOTE — Telephone Encounter (Signed)
Reason for Disposition ? [1] MODERATE pain (e.g., interferes with normal activities, limping) AND [2] present > 3 days ? ?Answer Assessment - Initial Assessment Questions ?1. ONSET: "When did the pain start?"  ?    Cramping in leg-got worse at 4am today ?2. LOCATION: "Where is the pain located?"  ?    Thigh area- R leg ?3. PAIN: "How bad is the pain?"    (Scale 1-10; or mild, moderate, severe) ?  -  MILD (1-3): doesn't interfere with normal activities  ?  -  MODERATE (4-7): interferes with normal activities (e.g., work or school) or awakens from sleep, limping  ?  -  SEVERE (8-10): excruciating pain, unable to do any normal activities, unable to walk ?    moderate ?4. WORK OR EXERCISE: "Has there been any recent work or exercise that involved this part of the body?"  ?    no ?5. CAUSE: "What do you think is causing the leg pain?" ?    Patient was seen last month for leg pain- was given cortisone shots ?6. OTHER SYMPTOMS: "Do you have any other symptoms?" (e.g., chest pain, back pain, breathing difficulty, swelling, rash, fever, numbness, weakness) ?    Radiates inner thigh to hip ?7. PREGNANCY: "Is there any chance you are pregnant?" "When was your last menstrual period?" ?    *No Answer* ? ?Protocols used: Leg Pain-A-AH ? ?

## 2021-07-13 NOTE — Telephone Encounter (Signed)
?  Chief Complaint: thigh pain ?Symptoms: pain/cramping in thigh ?Frequency: worse early this evening ?Pertinent Negatives: Patient denies  chest pain, back pain, breathing difficulty, swelling, rash, fever, numbness, weakness ?Disposition: [] ED /[] Urgent Care (no appt availability in office) / [x] Appointment(In office/virtual)/ []  [place]/ [] Home Care/ [] Refused Recommended Disposition /[] [facility]/ []  Follow-up with PCP ?Additional Notes:    ?

## 2021-07-15 ENCOUNTER — Ambulatory Visit
Admission: RE | Admit: 2021-07-15 | Discharge: 2021-07-15 | Disposition: A | Discharge status: Home / Self Care | Payer: Self-pay | Source: Ambulatory Visit | Attending: Family Medicine | Admitting: Family Medicine

## 2021-07-15 DIAGNOSIS — M1611 Unilateral primary osteoarthritis, right hip: Secondary | ICD-10-CM

## 2021-07-17 ENCOUNTER — Encounter: Payer: Self-pay | Admitting: Family Medicine

## 2021-07-17 NOTE — Telephone Encounter (Signed)
Please advise 

## 2021-07-18 ENCOUNTER — Ambulatory Visit: Payer: Self-pay | Admitting: Family Medicine

## 2021-07-18 ENCOUNTER — Encounter: Payer: Self-pay | Admitting: Family Medicine

## 2021-07-18 VITALS — BP 118/78 | HR 87 | Ht 65.0 in | Wt 181.0 lb

## 2021-07-18 DIAGNOSIS — M87051 Idiopathic aseptic necrosis of right femur: Secondary | ICD-10-CM

## 2021-07-18 DIAGNOSIS — Z13820 Encounter for screening for osteoporosis: Secondary | ICD-10-CM

## 2021-07-18 DIAGNOSIS — Z716 Tobacco abuse counseling: Secondary | ICD-10-CM

## 2021-07-18 MED ORDER — ALENDRONATE SODIUM 70 MG PO TABS: 70.0000 mg | ORAL_TABLET | ORAL | 11 refills | Status: DC

## 2021-07-18 NOTE — Assessment & Plan Note (Signed)
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  She has started nicotine patch and has plans to see her PCP Dr. Elizabeth Sauer for further evaluation and management strategies to aid in her smoking cessation. ?

## 2021-07-18 NOTE — Progress Notes (Signed)
?  ? ?Primary Care / Sports Medicine Office Visit ? ?Patient Information:  ?Patient ID: Debbie Harrington, female DOB: 04-05-1965 Age: 56 y.o. MRN: [id number]  ? ?Debbie Harrington is a pleasant 56 y.o. female presenting with the following: ? ?Chief Complaint  ?Patient presents with  ? MRI Results  ?  Pt here to talk about MRI results, pt states she is feeling better as long as she is taking her medication   ? ? ?Vitals:  ? 07/18/21 1101  ?BP: 118/78  ?Pulse: 87  ?SpO2: 95%  ? ?Vitals:  ? 07/18/21 1101  ?Weight: 181 lb (82.1 kg)  ?Height: 5\' 5"  (1.651 m)  ? ?Body mass index is 30.12 kg/m?. ? ?MR HIP RIGHT WO CONTRAST ? ?Result Date: 07/15/2021 ?CLINICAL DATA:  Right hip pain for 6 months. EXAM: MR OF THE RIGHT HIP WITHOUT CONTRAST TECHNIQUE: Multiplanar, multisequence MR imaging was performed. No intravenous contrast was administered. COMPARISON:  Radiographs 05/22/2021 FINDINGS: Both hips are normally located. There are significant age advanced bilateral hip joint degenerative changes, right greater than left with cartilage loss, joint space narrowing and osteophytic spurring. There is also a focus of AVN involving the right femoral head without definite findings for head collapse. There is associated moderate edema tracking into the femoral neck and intertrochanteric region. Associated small right hip joint effusion. The pubic symphysis and SI joints are intact. Mild degenerative changes. No pelvic stress or insufficiency fractures or bone lesions. No significant peritendinitis and no trochanteric bursitis. The surrounding hip and pelvic musculature are intact. No muscle tear or myositis. The hamstring tendons are intact. No significant intrapelvic abnormalities are identified. No inguinal mass or inguinal hernia. IMPRESSION: 1. Significant age advanced bilateral hip joint degenerative changes, right greater than left. 2. Moderate-sized focus of AVN involving the right femoral head without definite findings for head  collapse. Associated marrow edema and joint effusion. 3. Intact bony pelvis. 4. No significant intrapelvic abnormalities. Electronically Signed   By: 05/24/2021 M.D.   On: 07/15/2021 10:36  ? ?07/17/2021 LIMITED JOINT SPACE STRUCTURES LOW RIGHT ? ?Result Date: 06/19/2021 ?Procedure:  Injection of the right intra-articular knee under ultrasound guidance. Ultrasound guidance utilized for out of plane anteromedial approach no effusion noted Samsung HS60 device utilized with permanent recording / reporting. Verbal informed consent obtained and verified. Skin prepped in a sterile fashion. Ethyl chloride for topical local analgesia. Completed without difficulty and tolerated well. Medication: triamcinolone acetonide 40 mg/mL suspension for injection 1 mL total and 2 mL lidocaine 1% without epinephrine utilized for needle placement anesthetic Advised to contact for fevers/chills, erythema, induration, drainage, or persistent bleeding. Procedure:  Injection of right pes anserine bursa under ultrasound guidance. Ultrasound guidance utilized for out of plane approach, no tendon abnormality is readily apparent Samsung HS60 device utilized with permanent recording / reporting. Verbal informed consent obtained and verified. Skin prepped in a sterile fashion. Ethyl chloride for topical local analgesia. Completed without difficulty and tolerated well. Medication: triamcinolone acetonide 40 mg/mL suspension for injection 1 mL total and 2 mL lidocaine 1% without epinephrine utilized for needle placement anesthetic Advised to contact for fevers/chills, erythema, induration, drainage, or persistent bleeding. Procedure:  Injection of right greater trochanter under ultrasound guidance. Ultrasound guidance utilized for out of plane approach, no effusion noted Samsung HS60 device utilized with permanent recording / reporting. Verbal informed consent obtained and verified. Skin prepped in a sterile fashion. Ethyl chloride for topical local  analgesia. Completed without difficulty and tolerated well. Medication: triamcinolone  acetonide 40 mg/mL suspension for injection 1 mL total and 2 mL lidocaine 1% without epinephrine utilized for needle placement anesthetic Advised to contact for fevers/chills, erythema, induration, drainage, or persistent bleeding. Procedure:  Injection of right intra-articular hip under ultrasound guidance. Ultrasound guidance utilized for in-plane approach, no joint effusion visualized Samsung HS60 device utilized with permanent recording / reporting. Verbal informed consent obtained and verified. Skin prepped in a sterile fashion. Ethyl chloride for topical local analgesia. Completed without difficulty and tolerated well. Medication: triamcinolone acetonide 40 mg/mL suspension for injection 1 mL total and 2 mL lidocaine 1% without epinephrine utilized for needle placement anesthetic Advised to contact for fevers/chills, erythema, induration, drainage, or persistent bleeding.   ? ?Independent interpretation of notes and tests performed by another provider:  ? ?None ? ?Procedures performed:  ? ?None ? ?Pertinent History, Exam, Impression, and Recommendations:  ? ?Problem List Items Addressed This Visit   ? ?  ? Musculoskeletal and Integument  ? Avascular necrosis of bone of right hip (HCC) - Primary  ?  Following patient's recent painful right hip episode, stat MRI right hip was obtained which revealed avascular necrosis of the right hip joint.  I did discuss associated risk factors including modifiable factors such as smoking cessation. Exam shows persistent, though significantly improved symptoms localizing to the greater trochanter and the hip joint on the right. ? ?Plan for bone health labs, DEXA, start of Fosamax weekly, smoking cessation, home exercises, and 3 month follow-up. Will add vitamin D if serum levels low. ? ?  ?  ? Relevant Medications  ? alendronate (FOSAMAX) 70 MG tablet  ? Other Relevant Orders  ? CBC  ?  Phosphorus  ? Comprehensive metabolic panel  ? TSH  ? VITAMIN D 25 Hydroxy (Vit-D Deficiency, Fractures)  ? DG Bone Density  ?  ? Other  ? Tobacco abuse counseling  ?  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  She has started nicotine patch and has plans to see her PCP Dr. Elizabeth Sauer for further evaluation and management strategies to aid in her smoking cessation. ? ?  ?  ? ?Other Visit Diagnoses   ? ? Screening for osteoporosis      ? Relevant Orders  ? DG Bone Density  ? ?  ?  ? ?Orders & Medications ?Meds ordered this encounter  ?Medications  ? alendronate (FOSAMAX) 70 MG tablet  ?  Sig: Take 1 tablet (70 mg total) by mouth every 7 (seven) days.  ?  Dispense:  4 tablet  ?  Refill:  11  ? ?Orders Placed This Encounter  ?Procedures  ? DG Bone Density  ? CBC  ? Phosphorus  ? Comprehensive metabolic panel  ? TSH  ? VITAMIN D 25 Hydroxy (Vit-D Deficiency, Fractures)  ?  ? ?Return in about 3 months (around 10/17/2021).  ?  ? ?Jerrol Banana, MD ? ? Primary Care Sports Medicine ?[facility] ?[facility] [place]  ? ?

## 2021-07-18 NOTE — Patient Instructions (Signed)
-   Obtain labs today ?- Start Fosamax once weekly ?- Can transition to as needed dosing of previously prescribed medications for pain control ?- Use right hip symptoms as a guide for activity, use cane if needed ?- Start home exercises from following website after 2 weeks: ?https://orthoinfo.aaos.org/en/recovery/hip-conditioning-program/hip-pdf/ ?- Return for follow-up in 3 months ?

## 2021-07-18 NOTE — Assessment & Plan Note (Addendum)
Following patient's recent painful right hip episode, stat MRI right hip was obtained which revealed avascular necrosis of the right hip joint.  I did discuss associated risk factors including modifiable factors such as smoking cessation. Exam shows persistent, though significantly improved symptoms localizing to the greater trochanter and the hip joint on the right. ? ?Plan for bone health labs, DEXA, start of Fosamax weekly, smoking cessation, home exercises, and 3 month follow-up. Will add vitamin D if serum levels low. ?

## 2021-07-19 ENCOUNTER — Other Ambulatory Visit: Payer: Self-pay | Admitting: Family Medicine

## 2021-07-19 DIAGNOSIS — R7989 Other specified abnormal findings of blood chemistry: Secondary | ICD-10-CM

## 2021-07-19 LAB — COMPREHENSIVE METABOLIC PANEL
ALT: 35 IU/L — ABNORMAL HIGH (ref 0–32)
AST: 16 IU/L (ref 0–40)
Albumin/Globulin Ratio: 1.9 (ref 1.2–2.2)
Albumin: 4.2 g/dL (ref 3.8–4.9)
Alkaline Phosphatase: 60 IU/L (ref 44–121)
BUN/Creatinine Ratio: 25 — ABNORMAL HIGH (ref 9–23)
BUN: 18 mg/dL (ref 6–24)
Bilirubin Total: 0.4 mg/dL (ref 0.0–1.2)
CO2: 25 mmol/L (ref 20–29)
Calcium: 9.3 mg/dL (ref 8.7–10.2)
Chloride: 103 mmol/L (ref 96–106)
Creatinine, Ser: 0.73 mg/dL (ref 0.57–1.00)
Globulin, Total: 2.2 g/dL (ref 1.5–4.5)
Glucose: 89 mg/dL (ref 70–99)
Potassium: 4.6 mmol/L (ref 3.5–5.2)
Sodium: 142 mmol/L (ref 134–144)
Total Protein: 6.4 g/dL (ref 6.0–8.5)
eGFR: 96 mL/min/{1.73_m2} (ref >59)

## 2021-07-19 LAB — CBC
Hematocrit: 39.2 % (ref 34.0–46.6)
Hemoglobin: 13.6 g/dL (ref 11.1–15.9)
MCH: 33.8 pg — ABNORMAL HIGH (ref 26.6–33.0)
MCHC: 34.7 g/dL (ref 31.5–35.7)
MCV: 98 fL — ABNORMAL HIGH (ref 79–97)
Platelets: 311 10*3/uL (ref 150–450)
RBC: 4.02 x10E6/uL (ref 3.77–5.28)
RDW: 12.7 % (ref 11.7–15.4)
WBC: 6.7 10*3/uL (ref 3.4–10.8)

## 2021-07-19 LAB — VITAMIN D 25 HYDROXY (VIT D DEFICIENCY, FRACTURES): Vit D, 25-Hydroxy: 8.6 ng/mL — ABNORMAL LOW (ref 30.0–100.0)

## 2021-07-19 LAB — TSH: TSH: 2.83 u[IU]/mL (ref 0.450–4.500)

## 2021-07-19 LAB — PHOSPHORUS: Phosphorus: 3.7 mg/dL (ref 3.0–4.3)

## 2021-07-19 MED ORDER — VITAMIN D (ERGOCALCIFEROL) 1.25 MG (50000 UNIT) PO CAPS
50000.0000 [IU] | ORAL_CAPSULE | ORAL | 0 refills | Status: DC
Start: 2021-07-19 — End: 2021-09-12

## 2021-07-19 NOTE — Telephone Encounter (Signed)
Discussed during office visit.

## 2021-07-23 ENCOUNTER — Other Ambulatory Visit: Payer: Self-pay | Admitting: Family Medicine

## 2021-07-23 ENCOUNTER — Other Ambulatory Visit: Payer: Self-pay

## 2021-07-23 DIAGNOSIS — M87051 Idiopathic aseptic necrosis of right femur: Secondary | ICD-10-CM

## 2021-07-23 MED ORDER — CYCLOBENZAPRINE HCL 10 MG PO TABS: 10.0000 mg | ORAL_TABLET | Freq: Three times a day (TID) | ORAL | 0 refills | Status: DC | PRN

## 2021-07-23 NOTE — Telephone Encounter (Signed)
Medication Refill - Medication: cyclobenzaprine (FLEXERIL) 10 MG tablet ? ?Has the patient contacted their pharmacy? No. ?( ?Preferred Pharmacy (with phone number or street name): [id number] - [place], [place] - [address] [place] [address] AT [address] [place] [address] ?Has the patient been seen for an appointment in the last year OR does the patient have an upcoming appointment? Yes.   ? ?Agent: Please be advised that RX refills may take up to 3 business days. We ask that you follow-up with your pharmacy. ? ?

## 2021-07-24 ENCOUNTER — Ambulatory Visit: Payer: Self-pay | Admitting: Family Medicine

## 2021-07-24 ENCOUNTER — Encounter: Payer: Self-pay | Admitting: Family Medicine

## 2021-07-24 VITALS — BP 120/80 | HR 78 | Ht 65.0 in | Wt 179.0 lb

## 2021-07-24 DIAGNOSIS — Z716 Tobacco abuse counseling: Secondary | ICD-10-CM

## 2021-07-24 MED ORDER — BUPROPION HCL 75 MG PO TABS: 75.0000 mg | ORAL_TABLET | Freq: Two times a day (BID) | ORAL | 1 refills | Status: DC

## 2021-07-24 NOTE — Telephone Encounter (Signed)
Requested medication (s) are due for refill today - no ? ?Requested medication (s) are on the active medication list -yes ? ?Future visit scheduled -yes ? ?Last refill: 07/23/21 #60 ? ?Notes to clinic: Request RF: Duplicate request- filled by office 07/23/21, non delegated Rx ? ?Requested Prescriptions  ?Pending Prescriptions Disp Refills  ? cyclobenzaprine (FLEXERIL) 10 MG tablet 60 tablet 0  ?  Sig: Take 1 tablet (10 mg total) by mouth 3 (three) times daily as needed for muscle spasms.  ?  ? Not Delegated - Analgesics:  Muscle Relaxants Failed - 07/23/2021  5:17 PM  ?  ?  Failed - This refill cannot be delegated  ?  ?  Passed - Valid encounter within last 6 months  ?  Recent Outpatient Visits   ? ?      ? 6 days ago Avascular necrosis of bone of right hip (HCC)  ? [facility] Jerrol Banana, MD  ? 1 week ago Primary osteoarthritis of right hip  ? [facility] Jerrol Banana, MD  ? 1 month ago Primary osteoarthritis of right knee  ? [facility] Jerrol Banana, MD  ? 1 month ago Primary osteoarthritis of right knee  ? [facility] Jerrol Banana, MD  ? 2 months ago Quadriceps muscle strain, right, initial encounter  ? [facility] Duanne Limerick, MD  ? ?  ?  ?Future Appointments   ? ?        ? Today Duanne Limerick, MD [facility]  ? In 2 months Ashley Royalty, Ocie Bob, MD [facility]  ? ?  ? ? ?  ?  ?  ? ? ? ?Requested Prescriptions  ?Pending Prescriptions Disp Refills  ? cyclobenzaprine (FLEXERIL) 10 MG tablet 60 tablet 0  ?  Sig: Take 1 tablet (10 mg total) by mouth 3 (three) times daily as needed for muscle spasms.  ?  ? Not Delegated - Analgesics:  Muscle Relaxants Failed - 07/23/2021  5:17 PM  ?  ?  Failed - This refill cannot be delegated  ?  ?  Passed - Valid encounter within last 6 months  ?  Recent Outpatient Visits   ? ?      ? 6 days ago Avascular necrosis of bone of right hip (HCC)  ? [facility] Jerrol Banana, MD   ? 1 week ago Primary osteoarthritis of right hip  ? [facility] Jerrol Banana, MD  ? 1 month ago Primary osteoarthritis of right knee  ? [facility] Jerrol Banana, MD  ? 1 month ago Primary osteoarthritis of right knee  ? [facility] Jerrol Banana, MD  ? 2 months ago Quadriceps muscle strain, right, initial encounter  ? [facility] Duanne Limerick, MD  ? ?  ?  ?Future Appointments   ? ?        ? Today Duanne Limerick, MD [facility]  ? In 2 months Ashley Royalty, Ocie Bob, MD [facility]  ? ?  ? ? ?  ?  ?  ? ? ? ?

## 2021-07-24 NOTE — Progress Notes (Signed)
? ? ?Date:  07/24/2021  ? ?Name:  Debbie Harrington   DOB:  February 04, 1966   MRN:  [id number] ? ? ?Chief Complaint: Nicotine Dependence (X 1 week on Patches, not helping , wellbutrin was mentioned to patient, pt is still smoking the same amount with patches ) ? ?HPI ? ?Lab Results  ?Component Value Date  ? NA 142 07/18/2021  ? K 4.6 07/18/2021  ? CO2 25 07/18/2021  ? GLUCOSE 89 07/18/2021  ? BUN 18 07/18/2021  ? CREATININE 0.73 07/18/2021  ? CALCIUM 9.3 07/18/2021  ? EGFR 96 07/18/2021  ? ?No results found for: CHOL, HDL, LDLCALC, LDLDIRECT, TRIG, CHOLHDL ?Lab Results  ?Component Value Date  ? TSH 2.830 07/18/2021  ? ?No results found for: HGBA1C ?Lab Results  ?Component Value Date  ? WBC 6.7 07/18/2021  ? HGB 13.6 07/18/2021  ? HCT 39.2 07/18/2021  ? MCV 98 (H) 07/18/2021  ? PLT 311 07/18/2021  ? ?Lab Results  ?Component Value Date  ? ALT 35 (H) 07/18/2021  ? AST 16 07/18/2021  ? ALKPHOS 60 07/18/2021  ? BILITOT 0.4 07/18/2021  ? ?Lab Results  ?Component Value Date  ? VD25OH 8.6 (L) 07/18/2021  ?  ? ?Review of Systems ? ?Patient Active Problem List  ? Diagnosis Date Noted  ? Avascular necrosis of bone of right hip ([place]) 07/18/2021  ? Tobacco abuse counseling 07/18/2021  ? Pes anserinus bursitis of right knee 06/19/2021  ? Greater 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 06/19/2021  ? Primary osteoarthritis of right hip 06/01/2021  ? Primary osteoarthritis of right knee 02/08/2021  ? Internal derangement of right knee 02/08/2021  ? Right foot pain 07/02/2013  ? Reflux 07/02/2013  ? ? ?No Known Allergies ? ?Past Surgical History:  ?Procedure Laterality Date  ? ABDOMINAL HYSTERECTOMY    ? arm fracture    ? COLONOSCOPY WITH PROPOFOL N/A 01/17/2020  ? Procedure: COLONOSCOPY WITH PROPOFOL;  Surgeon: Lesly Rubenstein, MD;  Location: [facility] ENDOSCOPY;  Service: Endoscopy;  Laterality: N/A;  ? FACIAL RECONSTRUCTION SURGERY    ? FRACTURE SURGERY    ? ? ?Social History  ? ?Tobacco Use  ? Smoking status: Every Day  ?   Packs/day: 1.00  ?  Types: Cigarettes  ? Smokeless tobacco: Never  ?Vaping Use  ? Vaping Use: Never used  ?Substance Use Topics  ? Alcohol use: Yes  ? Drug use: Never  ? ? ? ?Medication list has been reviewed and updated. ? ?Current Meds  ?Medication Sig  ? acetaminophen (TYLENOL) 500 MG tablet Take 1,000 mg by mouth every 6 (six) hours as needed.  ? alendronate (FOSAMAX) 70 MG tablet Take 1 tablet (70 mg total) by mouth every 7 (seven) days.  ? cyclobenzaprine (FLEXERIL) 10 MG tablet Take 1 tablet (10 mg total) by mouth 3 (three) times daily as needed for muscle spasms.  ? nicotine (NICODERM CQ - DOSED IN MG/24 HOURS) 21 mg/24hr patch Place 21 mg onto the skin daily.  ? Vitamin D, Ergocalciferol, (DRISDOL) 1.25 MG (50000 UNIT) CAPS capsule Take 1 capsule (50,000 Units total) by mouth every 7 (seven) days. Take for 8 total doses(weeks)  ? ? ? ?  07/24/2021  ?  2:00 PM 07/18/2021  ? 11:05 AM 07/13/2021  ?  2:55 PM 05/31/2021  ?  9:43 AM  ?GAD 7 : Generalized Anxiety Score  ?Nervous, Anxious, on Edge 0 0 0 0  ?Control/stop worrying 0 0 0 0  ?Worry too much - different  things 0 0 0 0  ?Trouble relaxing 0 0 0 0  ?Restless 0 0 0 0  ?Easily annoyed or irritable 0 0 0 0  ?Afraid - awful might happen 0 0 0 0  ?Total GAD 7 Score 0 0 0 0  ?Anxiety Difficulty  Not difficult at all Not difficult at all Not difficult at all  ? ? ? ?  07/24/2021  ?  2:00 PM  ?Depression screen PHQ 2/9  ?Decreased Interest 0  ?Down, Depressed, Hopeless 0  ?PHQ - 2 Score 0  ?Altered sleeping 0  ?Tired, decreased energy 0  ?Change in appetite 0  ?Feeling bad or failure about yourself  0  ?Trouble concentrating 0  ?Moving slowly or fidgety/restless 0  ?Suicidal thoughts 0  ?PHQ-9 Score 0  ? ? ?BP Readings from Last 3 Encounters:  ?07/24/21 120/80  ?07/18/21 118/78  ?07/13/21 120/80  ? ? ?Physical Exam ?Vitals and nursing note reviewed.  ?Eyes:  ?   Pupils: Pupils are equal, round, and reactive to light.  ?Cardiovascular:  ?   Rate and Rhythm: Normal rate.  ?    Heart sounds: No murmur heard. ?  No gallop.  ?Pulmonary:  ?   Breath sounds: No wheezing, rhonchi or rales.  ?Abdominal:  ?   Tenderness: There is no guarding or rebound.  ? ? ?Wt Readings from Last 3 Encounters:  ?07/24/21 179 lb (81.2 kg)  ?07/18/21 181 lb (82.1 kg)  ?07/13/21 178 lb 3.2 oz (80.8 kg)  ? ? ?BP 120/80   Pulse 78   Ht 5' 5"  (1.651 m)   Wt 179 lb (81.2 kg)   BMI 29.79 kg/m?  ? ?Assessment and Plan: ? ?Patient is 56 year old with a long history of smoking with a greater than 20-pack-year history.  We will inquire about the possibility of a low-dose spiral CT for screening purposes.  PHQ and GAD scores are 0. ?1. Tobacco abuse counseling ?Chronic.  Persistent.  Stable.  Discussed different methods and patient is currently at 14 mg of NicoDerm patch and I have encouraged her to go up to the 21 dosing and we will initiate bupropion 75 mg twice a day.  In the event that the PHQ and gad scores are 0 we will start at the lowest dosing and taper as necessary she has been instructed to take 1 a day for the first 2 weeks and then go to twice a day ?- buPROPion (WELLBUTRIN) 75 MG tablet; Take 1 tablet (75 mg total) by mouth 2 (two) times daily.  Dispense: 60 tablet; Refill: 1  ? ?

## 2021-09-06 ENCOUNTER — Ambulatory Visit
Admission: RE | Admit: 2021-09-06 | Discharge: 2021-09-06 | Disposition: A | Discharge status: Home / Self Care | Payer: 59 | Source: Ambulatory Visit | Attending: Family Medicine | Admitting: Family Medicine

## 2021-09-06 ENCOUNTER — Telehealth: Payer: Self-pay | Admitting: Family Medicine

## 2021-09-06 DIAGNOSIS — M87051 Idiopathic aseptic necrosis of right femur: Secondary | ICD-10-CM | POA: Diagnosis not present

## 2021-09-06 DIAGNOSIS — Z780 Asymptomatic menopausal state: Secondary | ICD-10-CM | POA: Diagnosis not present

## 2021-09-06 DIAGNOSIS — Z13820 Encounter for screening for osteoporosis: Secondary | ICD-10-CM | POA: Diagnosis not present

## 2021-09-06 NOTE — Telephone Encounter (Signed)
FYI. Pts results are in her chart.  KP

## 2021-09-06 NOTE — Telephone Encounter (Signed)
Copied from CRM [id number]. Topic: General - Other >> Sep 06, 2021 11:26 AM Melynda Keller J wrote: Reason for CRM: Pt would like a call about her Bone density result when they come in / please advise

## 2021-09-06 NOTE — Telephone Encounter (Signed)
Informed pt with normal bone density. Pt verbalized understanding.  KP

## 2021-09-10 ENCOUNTER — Other Ambulatory Visit: Payer: Self-pay | Admitting: Family Medicine

## 2021-09-10 DIAGNOSIS — R7989 Other specified abnormal findings of blood chemistry: Secondary | ICD-10-CM

## 2021-09-10 NOTE — Telephone Encounter (Signed)
Requested medications are due for refill today.  yes  Requested medications are on the active medications list.  yes  Last refill. 07/19/2021 #8 0 refills  Future visit scheduled.   yes  Notes to clinic.  Medication refill is not delegated.    Requested Prescriptions  Pending Prescriptions Disp Refills   Vitamin D, Ergocalciferol, (DRISDOL) 1.25 MG (50000 UNIT) CAPS capsule [Pharmacy Med Name: VITAMIN D2 50,000IU (ERGO) CAP RX] 8 capsule 0    Sig: TAKE ONE CAPSULE BY MOUTH EVERY 7 DAYS. TAKE FOR 8 TOTAL WEEKS     Endocrinology:  Vitamins - Vitamin D Supplementation 2 Failed - 09/10/2021  3:33 AM      Failed - Manual Review: Route requests for 50,000 IU strength to the provider      Failed - Vitamin D in normal range and within 360 days    Vit D, 25-Hydroxy  Date Value Ref Range Status  07/18/2021 8.6 (L) 30.0 - 100.0 ng/mL Final    Comment:    Vitamin D deficiency has been defined by the Institute of Medicine and an Endocrine Society practice guideline as a level of serum 25-OH vitamin D less than 20 ng/mL (1,2). The Endocrine Society went on to further define vitamin D insufficiency as a level between 21 and 29 ng/mL (2). 1. IOM (Institute of Medicine). 2010. Dietary reference    intakes for calcium and D. [place]: The    [employer]. 2. Holick MF, Binkley [place], Bischoff-Ferrari HA, et al.    Evaluation, treatment, and prevention of vitamin D    deficiency: an Endocrine Society clinical practice    guideline. JCEM. 2011 Jul; 96(7):1911-30.          Passed - Ca in normal range and within 360 days    Calcium  Date Value Ref Range Status  07/18/2021 9.3 8.7 - 10.2 mg/dL Final         Passed - Valid encounter within last 12 months    Recent Outpatient Visits           1 month ago Tobacco abuse counseling   [facility] Duanne Limerick, MD   1 month ago Avascular necrosis of bone of right hip [facility])   [facility] Jerrol Banana, MD    1 month ago Primary osteoarthritis of right hip   [facility] Jerrol Banana, MD   2 months ago Primary osteoarthritis of right knee   [facility] Jerrol Banana, MD   3 months ago Primary osteoarthritis of right knee   [facility] Jerrol Banana, MD       Future Appointments             Tomorrow Duanne Limerick, MD [facility]   In 1 month Ashley Royalty, Ocie Bob, MD [facility]

## 2021-09-11 ENCOUNTER — Ambulatory Visit: Payer: Medicaid Other | Admitting: Family Medicine

## 2021-09-12 ENCOUNTER — Ambulatory Visit (INDEPENDENT_AMBULATORY_CARE_PROVIDER_SITE_OTHER): Payer: 59 | Admitting: Family Medicine

## 2021-09-12 ENCOUNTER — Encounter: Payer: Self-pay | Admitting: Family Medicine

## 2021-09-12 VITALS — BP 130/80 | HR 88 | Ht 65.0 in | Wt 174.0 lb

## 2021-09-12 DIAGNOSIS — R69 Illness, unspecified: Secondary | ICD-10-CM | POA: Diagnosis not present

## 2021-09-12 DIAGNOSIS — L739 Follicular disorder, unspecified: Secondary | ICD-10-CM | POA: Diagnosis not present

## 2021-09-12 DIAGNOSIS — F17213 Nicotine dependence, cigarettes, with withdrawal: Secondary | ICD-10-CM

## 2021-09-12 MED ORDER — BUPROPION HCL 75 MG PO TABS: 75.0000 mg | ORAL_TABLET | Freq: Two times a day (BID) | ORAL | 5 refills | Status: DC

## 2021-09-12 MED ORDER — MUPIROCIN 2 % EX OINT: 1.0000 | TOPICAL_OINTMENT | Freq: Two times a day (BID) | CUTANEOUS | 0 refills | Status: DC

## 2021-09-12 NOTE — Progress Notes (Signed)
Date:  09/12/2021   Name:  Debbie Harrington   DOB:  1965/09/19   MRN:  [id number]   Chief Complaint: Nicotine Dependence (From 1.5 packs a day to one pack a week, having headaches )  Nicotine Dependence Presents for follow-up visit. Symptoms are negative for sore throat. Her urge triggers include company of smokers. The symptoms have been improving. She smokes < 1/2 a pack of cigarettes per day. Compliance with prior treatments has been good.    Lab Results  Component Value Date   NA 142 07/18/2021   K 4.6 07/18/2021   CO2 25 07/18/2021   GLUCOSE 89 07/18/2021   BUN 18 07/18/2021   CREATININE 0.73 07/18/2021   CALCIUM 9.3 07/18/2021   EGFR 96 07/18/2021   No results found for: "CHOL", "HDL", "LDLCALC", "LDLDIRECT", "TRIG", "CHOLHDL" Lab Results  Component Value Date   TSH 2.830 07/18/2021   No results found for: "HGBA1C" Lab Results  Component Value Date   WBC 6.7 07/18/2021   HGB 13.6 07/18/2021   HCT 39.2 07/18/2021   MCV 98 (H) 07/18/2021   PLT 311 07/18/2021   Lab Results  Component Value Date   ALT 35 (H) 07/18/2021   AST 16 07/18/2021   ALKPHOS 60 07/18/2021   BILITOT 0.4 07/18/2021   Lab Results  Component Value Date   VD25OH 8.6 (L) 07/18/2021     Review of Systems  Constitutional:  Negative for chills and fever.  HENT:  Negative for drooling, ear discharge, ear pain and sore throat.   Respiratory:  Negative for cough, shortness of breath and wheezing.   Cardiovascular:  Negative for chest pain, palpitations and leg swelling.  Gastrointestinal:  Negative for abdominal pain, blood in stool, constipation, diarrhea and nausea.  Endocrine: Negative for polydipsia.  Genitourinary:  Negative for dysuria, frequency, hematuria and urgency.  Musculoskeletal:  Negative for back pain, myalgias and neck pain.  Skin:  Negative for rash.  Allergic/Immunologic: Negative for environmental allergies.  Neurological:  Negative for dizziness and headaches.   Hematological:  Does not bruise/bleed easily.  Psychiatric/Behavioral:  Negative for suicidal ideas. The patient is not nervous/anxious.     Patient Active Problem List   Diagnosis Date Noted   Avascular necrosis of bone of right hip ([place]) 07/18/2021   Tobacco abuse counseling 07/18/2021   Pes anserinus bursitis of right knee 06/19/2021   Greater 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 06/19/2021   Primary osteoarthritis of right hip 06/01/2021   Primary osteoarthritis of right knee 02/08/2021   Internal derangement of right knee 02/08/2021   Right foot pain 07/02/2013   Reflux 07/02/2013    No Known Allergies  Past Surgical History:  Procedure Laterality Date   ABDOMINAL HYSTERECTOMY     arm fracture     COLONOSCOPY WITH PROPOFOL N/A 01/17/2020   Procedure: COLONOSCOPY WITH PROPOFOL;  Surgeon: Lesly Rubenstein, MD;  Location: [facility] ENDOSCOPY;  Service: Endoscopy;  Laterality: N/A;   FACIAL RECONSTRUCTION SURGERY     FRACTURE SURGERY      Social History   Tobacco Use   Smoking status: Some Days    Packs/day: 0.25    Types: Cigarettes   Smokeless tobacco: Never  Vaping Use   Vaping Use: Never used  Substance Use Topics   Alcohol use: Yes   Drug use: Never     Medication list has been reviewed and updated.  Current Meds  Medication Sig   acetaminophen (TYLENOL) 500 MG tablet Take 1,000  mg by mouth every 6 (six) hours as needed.   buPROPion (WELLBUTRIN) 75 MG tablet Take 1 tablet (75 mg total) by mouth 2 (two) times daily.   cyclobenzaprine (FLEXERIL) 10 MG tablet Take 1 tablet (10 mg total) by mouth 3 (three) times daily as needed for muscle spasms.       09/12/2021   11:11 AM 07/24/2021    2:00 PM 07/18/2021   11:05 AM 07/13/2021    2:55 PM  GAD 7 : Generalized Anxiety Score  Nervous, Anxious, on Edge 0 0 0 0  Control/stop worrying 0 0 0 0  Worry too much - different things 0 0 0 0  Trouble relaxing 0 0 0 0  Restless 0 0 0 0  Easily annoyed  or irritable 0 0 0 0  Afraid - awful might happen 0 0 0 0  Total GAD 7 Score 0 0 0 0  Anxiety Difficulty Not difficult at all  Not difficult at all Not difficult at all       09/12/2021   11:11 AM  Depression screen PHQ 2/9  Decreased Interest 0  Down, Depressed, Hopeless 0  PHQ - 2 Score 0  Altered sleeping 0  Tired, decreased energy 0  Change in appetite 0  Feeling bad or failure about yourself  0  Trouble concentrating 0  Moving slowly or fidgety/restless 0  Suicidal thoughts 0  PHQ-9 Score 0  Difficult doing work/chores Not difficult at all    BP Readings from Last 3 Encounters:  09/12/21 130/80  07/24/21 120/80  07/18/21 118/78    Physical Exam Vitals and nursing note reviewed. Exam conducted with a chaperone present.  Constitutional:      General: She is not in acute distress.    Appearance: She is not diaphoretic.  HENT:     Head: Normocephalic and atraumatic.     Right Ear: Tympanic membrane, ear canal and external ear normal.     Left Ear: Tympanic membrane, ear canal and external ear normal.     Nose: Nose normal.  Eyes:     General:        Right eye: No discharge.        Left eye: No discharge.     Conjunctiva/sclera: Conjunctivae normal.     Pupils: Pupils are equal, round, and reactive to light.  Neck:     Thyroid: No thyromegaly.     Vascular: No JVD.  Cardiovascular:     Rate and Rhythm: Normal rate and regular rhythm.     Heart sounds: Normal heart sounds. No murmur heard.    No friction rub. No gallop.  Pulmonary:     Effort: Pulmonary effort is normal.     Breath sounds: Normal breath sounds. No wheezing, rhonchi or rales.  Abdominal:     General: Bowel sounds are normal.     Palpations: Abdomen is soft. There is no mass.     Tenderness: There is no abdominal tenderness. There is no guarding.  Musculoskeletal:        General: Normal range of motion.     Cervical back: Normal range of motion and neck supple.  Lymphadenopathy:      Cervical: No cervical adenopathy.  Skin:    General: Skin is warm and dry.  Neurological:     Mental Status: She is alert.     Deep Tendon Reflexes: Reflexes are normal and symmetric.     Wt Readings from Last 3 Encounters:  09/12/21 174 lb (  78.9 kg)  07/24/21 179 lb (81.2 kg)  07/18/21 181 lb (82.1 kg)    BP 130/80   Pulse 88   Ht 5' 5"  (1.651 m)   Wt 174 lb (78.9 kg)   BMI 28.96 kg/m   Assessment and Plan:  1. Cigarette nicotine dependence with withdrawal Chronic.  Improving.  Stable.  Currently has gone from 1-1/2 packs a day to less than half a pack a day.  We will continue buspirone 75 mg 1 twice a day.  Patient is also noted an improvement in her mood and her PHQ was noted to be 0. - buPROPion (WELLBUTRIN) 75 MG tablet; Take 1 tablet (75 mg total) by mouth 2 (two) times daily.  Dispense: 60 tablet; Refill: 5  2. Folliculitis New onset.  On the inside aspect of the left medial thigh is tenderness over a cystic area this is consistent with folliculitis and we will treat with warm compresses and Bactroban application. - mupirocin ointment (BACTROBAN) 2 %; Apply 1 Application topically 2 (two) times daily.  Dispense: 22 g; Refill: 0

## 2021-09-25 ENCOUNTER — Other Ambulatory Visit: Payer: Self-pay | Admitting: Family Medicine

## 2021-09-25 DIAGNOSIS — F17213 Nicotine dependence, cigarettes, with withdrawal: Secondary | ICD-10-CM

## 2021-10-17 ENCOUNTER — Ambulatory Visit (INDEPENDENT_AMBULATORY_CARE_PROVIDER_SITE_OTHER): Payer: 59 | Admitting: Family Medicine

## 2021-10-17 ENCOUNTER — Encounter: Payer: Self-pay | Admitting: Family Medicine

## 2021-10-17 ENCOUNTER — Telehealth: Payer: Self-pay | Admitting: Family Medicine

## 2021-10-17 VITALS — BP 130/80 | HR 80 | Ht 65.0 in | Wt 176.2 lb

## 2021-10-17 DIAGNOSIS — M25551 Pain in right hip: Secondary | ICD-10-CM | POA: Diagnosis not present

## 2021-10-17 DIAGNOSIS — M7051 Other bursitis of knee, right knee: Secondary | ICD-10-CM

## 2021-10-17 DIAGNOSIS — M1611 Unilateral primary osteoarthritis, right hip: Secondary | ICD-10-CM

## 2021-10-17 DIAGNOSIS — M7611 Psoas tendinitis, right hip: Secondary | ICD-10-CM

## 2021-10-17 DIAGNOSIS — M1711 Unilateral primary osteoarthritis, right knee: Secondary | ICD-10-CM | POA: Diagnosis not present

## 2021-10-17 NOTE — Telephone Encounter (Signed)
Pt wants to check with Dr. Ashley Royalty if it is ok for her to use her knee brace/ please advise

## 2021-10-18 MED ORDER — BACLOFEN 5 MG PO TABS: 5.0000 | ORAL_TABLET | Freq: Three times a day (TID) | ORAL | 0 refills | Status: DC | PRN

## 2021-10-18 NOTE — Telephone Encounter (Signed)
Called pt let her know that per Dr. Ashley Royalty she can wear her knee brace. Let pt know that baclofen was sent in to pharmacy. Pt verbalized understanding.  KP

## 2021-10-18 NOTE — Telephone Encounter (Signed)
Re this message pt has called and states that Dr Ashley Royalty nurse never returned call. She also states that after visit yesterday he was going to call her something in for pain and she states the drugstore never received. Call pt back to advise [phone]

## 2021-10-23 ENCOUNTER — Encounter: Payer: Self-pay | Admitting: Physical Therapy

## 2021-10-23 ENCOUNTER — Ambulatory Visit: Payer: 59 | Attending: Family Medicine | Admitting: Physical Therapy

## 2021-10-23 DIAGNOSIS — R262 Difficulty in walking, not elsewhere classified: Secondary | ICD-10-CM | POA: Diagnosis not present

## 2021-10-23 DIAGNOSIS — M25551 Pain in right hip: Secondary | ICD-10-CM | POA: Insufficient documentation

## 2021-10-23 DIAGNOSIS — M1611 Unilateral primary osteoarthritis, right hip: Secondary | ICD-10-CM | POA: Insufficient documentation

## 2021-10-23 DIAGNOSIS — M79604 Pain in right leg: Secondary | ICD-10-CM | POA: Insufficient documentation

## 2021-10-23 DIAGNOSIS — M1711 Unilateral primary osteoarthritis, right knee: Secondary | ICD-10-CM | POA: Insufficient documentation

## 2021-10-23 DIAGNOSIS — M7051 Other bursitis of knee, right knee: Secondary | ICD-10-CM | POA: Diagnosis not present

## 2021-10-23 DIAGNOSIS — M6281 Muscle weakness (generalized): Secondary | ICD-10-CM | POA: Diagnosis not present

## 2021-10-23 DIAGNOSIS — M7611 Psoas tendinitis, right hip: Secondary | ICD-10-CM | POA: Diagnosis not present
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
## 2021-10-24 NOTE — Assessment & Plan Note (Signed)
Patient presents with right anteromedial thigh pain, radiating from the hip to the medial knee, examination most consistent with pes tendinitis and medial hamstring overuse related symptoms.  Plan for referral to PT, as needed baclofen and follow-up in 2 months.  That window would allow for cortisone injection if indicated.

## 2021-10-24 NOTE — Progress Notes (Signed)
     Primary Care / Sports Medicine Office Visit  Patient Information:  Patient ID: KEIRSTYN AYDT, female DOB: [birth year] Age: 56 y.o. MRN: [id number]   Debbie Harrington is a pleasant 56 y.o. female presenting with the following:  Chief Complaint  Patient presents with   Right leg    Right leg pain on inside of thigh up to and through to hip. Nights are worst.     Vitals:   10/17/21 1039  BP: 130/80  Pulse: 80  SpO2: 98%   Vitals:   10/17/21 1039  Weight: 176 lb 3.2 oz (79.9 kg)  Height: 5\' 5"  (1.651 m)   Body mass index is 29.32 kg/m.  No results found.   Independent interpretation of notes and tests performed by another provider:   None  Procedures performed:   None  Pertinent History, Exam, Impression, and Recommendations:   Problem List Items Addressed This Visit       Musculoskeletal and Integument   Primary osteoarthritis of right knee    Patient presents with right anteromedial thigh pain, radiating from the hip to the medial knee, examination most consistent with pes tendinitis and medial hamstring overuse related symptoms.  Plan for referral to PT, as needed baclofen and follow-up in 2 months.  That window would allow for cortisone injection if indicated.      Relevant Medications   Baclofen 5 MG TABS   Other Relevant Orders   Ambulatory referral to Physical Therapy   Primary osteoarthritis of right hip   Relevant Medications   Baclofen 5 MG TABS   Other Relevant Orders   Ambulatory referral to Physical Therapy   Pes anserinus bursitis of right knee    Focal tenderness throughout the pes anserine tendons and bursa, additionally through the medial hamstring musculature in the setting of underlying right hip osteoarthritis and right knee osteoarthritis.  Plan for as needed baclofen, formal PT, home exercises, and can return in 2 months for reevaluation.  Recalcitrant symptoms can be addressed with a cortisone injection.      Relevant Medications    Baclofen 5 MG TABS   Other Relevant Orders   Ambulatory referral to Physical Therapy   Greater 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    Compensatory findings still noted at the right greater trochanteric region in the setting of underlying hip osteoarthritis.  I have advised as needed baclofen, formal physical therapy, and follow-up in 2 months for reevaluation.  Recalcitrant symptoms can be addressed with ultrasound-guided cortisone injection.      Relevant Orders   Ambulatory referral to Physical Therapy   Other Visit Diagnoses     Psoas tendinitis of right side    -  Primary   Relevant Medications   Baclofen 5 MG TABS   Other Relevant Orders   Ambulatory referral to Physical Therapy        Orders & Medications Meds ordered this encounter  Medications   Baclofen 5 MG TABS    Sig: Take 5-10 tablets by mouth 3 (three) times daily as needed.    Dispense:  90 tablet    Refill:  0   Orders Placed This Encounter  Procedures   Ambulatory referral to Physical Therapy     Return in about 2 months (around 12/18/2021).     12/20/2021, MD   Primary Care Sports Medicine [facility]

## 2021-10-24 NOTE — Patient Instructions (Signed)
-   Dose baclofen on an as-needed basis for muscle tightness pain, side effect can be drowsiness - Start physical therapy with referral provided, can contact number below for expedited scheduling - Return for follow-up in 2 months, contact our office for any questions/concerns between now and then

## 2021-10-24 NOTE — Assessment & Plan Note (Signed)
Compensatory findings still noted at the right greater trochanteric region in the setting of underlying hip osteoarthritis.  I have advised as needed baclofen, formal physical therapy, and follow-up in 2 months for reevaluation.  Recalcitrant symptoms can be addressed with ultrasound-guided cortisone injection.

## 2021-10-24 NOTE — Assessment & Plan Note (Signed)
Focal tenderness throughout the pes anserine tendons and bursa, additionally through the medial hamstring musculature in the setting of underlying right hip osteoarthritis and right knee osteoarthritis.  Plan for as needed baclofen, formal PT, home exercises, and can return in 2 months for reevaluation.  Recalcitrant symptoms can be addressed with a cortisone injection.

## 2021-10-25 ENCOUNTER — Encounter: Payer: Self-pay | Admitting: Physical Therapy

## 2021-10-25 ENCOUNTER — Ambulatory Visit: Payer: 59 | Admitting: Physical Therapy

## 2021-10-25 DIAGNOSIS — M25551 Pain in right hip: Secondary | ICD-10-CM | POA: Diagnosis not present

## 2021-10-25 DIAGNOSIS — R262 Difficulty in walking, not elsewhere classified: Secondary | ICD-10-CM | POA: Diagnosis not present

## 2021-10-25 DIAGNOSIS — M7051 Other bursitis of knee, right knee: Secondary | ICD-10-CM | POA: Diagnosis not present

## 2021-10-25 DIAGNOSIS — M6281 Muscle weakness (generalized): Secondary | ICD-10-CM

## 2021-10-25 DIAGNOSIS — M79604 Pain in right leg: Secondary | ICD-10-CM | POA: Diagnosis not present

## 2021-10-25 DIAGNOSIS — M1711 Unilateral primary osteoarthritis, right knee: Secondary | ICD-10-CM | POA: Diagnosis not present

## 2021-10-25 DIAGNOSIS — M7611 Psoas tendinitis, right hip: Secondary | ICD-10-CM | POA: Diagnosis not present

## 2021-10-25 DIAGNOSIS — M1611 Unilateral primary osteoarthritis, right hip: Secondary | ICD-10-CM | POA: Diagnosis not present
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
## 2021-10-30 ENCOUNTER — Encounter: Payer: Self-pay | Admitting: Physical Therapy

## 2021-10-30 ENCOUNTER — Ambulatory Visit: Payer: 59 | Admitting: Physical Therapy

## 2021-10-30 DIAGNOSIS — M7051 Other bursitis of knee, right knee: Secondary | ICD-10-CM | POA: Diagnosis not present

## 2021-10-30 DIAGNOSIS — M1611 Unilateral primary osteoarthritis, right hip: Secondary | ICD-10-CM | POA: Diagnosis not present

## 2021-10-30 DIAGNOSIS — M7611 Psoas tendinitis, right hip: Secondary | ICD-10-CM | POA: Diagnosis not present

## 2021-10-30 DIAGNOSIS — M6281 Muscle weakness (generalized): Secondary | ICD-10-CM | POA: Diagnosis not present

## 2021-10-30 DIAGNOSIS — R262 Difficulty in walking, not elsewhere classified: Secondary | ICD-10-CM

## 2021-10-30 DIAGNOSIS — M1711 Unilateral primary osteoarthritis, right knee: Secondary | ICD-10-CM | POA: Diagnosis not present

## 2021-10-30 DIAGNOSIS — M79604 Pain in right leg: Secondary | ICD-10-CM | POA: Diagnosis not present

## 2021-10-30 DIAGNOSIS — M25551 Pain in right hip: Secondary | ICD-10-CM | POA: Diagnosis not present
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
## 2021-11-06 ENCOUNTER — Encounter: Payer: Self-pay | Admitting: Physical Therapy

## 2021-11-06 ENCOUNTER — Ambulatory Visit: Payer: 59 | Admitting: Physical Therapy

## 2021-11-06 DIAGNOSIS — M25551 Pain in right hip: Secondary | ICD-10-CM | POA: Diagnosis not present

## 2021-11-06 DIAGNOSIS — M79604 Pain in right leg: Secondary | ICD-10-CM

## 2021-11-06 DIAGNOSIS — M7611 Psoas tendinitis, right hip: Secondary | ICD-10-CM | POA: Diagnosis not present

## 2021-11-06 DIAGNOSIS — M6281 Muscle weakness (generalized): Secondary | ICD-10-CM

## 2021-11-06 DIAGNOSIS — R262 Difficulty in walking, not elsewhere classified: Secondary | ICD-10-CM | POA: Diagnosis not present

## 2021-11-06 DIAGNOSIS — M1711 Unilateral primary osteoarthritis, right knee: Secondary | ICD-10-CM | POA: Diagnosis not present

## 2021-11-06 DIAGNOSIS — M1611 Unilateral primary osteoarthritis, right hip: Secondary | ICD-10-CM | POA: Diagnosis not present

## 2021-11-06 DIAGNOSIS — M7051 Other bursitis of knee, right knee: Secondary | ICD-10-CM | POA: Diagnosis not present
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
## 2021-11-07 DIAGNOSIS — M25561 Pain in right knee: Secondary | ICD-10-CM | POA: Diagnosis not present

## 2021-11-07 DIAGNOSIS — M1611 Unilateral primary osteoarthritis, right hip: Secondary | ICD-10-CM | POA: Diagnosis not present

## 2021-11-07 DIAGNOSIS — R6883 Chills (without fever): Secondary | ICD-10-CM | POA: Diagnosis not present

## 2021-11-07 DIAGNOSIS — M1711 Unilateral primary osteoarthritis, right knee: Secondary | ICD-10-CM | POA: Diagnosis not present

## 2021-11-07 DIAGNOSIS — R69 Illness, unspecified: Secondary | ICD-10-CM | POA: Diagnosis not present

## 2021-11-07 DIAGNOSIS — M7051 Other bursitis of knee, right knee: Secondary | ICD-10-CM | POA: Diagnosis not present

## 2021-11-07 DIAGNOSIS — R110 Nausea: Secondary | ICD-10-CM | POA: Diagnosis not present

## 2021-11-08 ENCOUNTER — Ambulatory Visit: Payer: 59 | Admitting: Physical Therapy

## 2021-11-13 ENCOUNTER — Encounter: Payer: Self-pay | Admitting: Physical Therapy

## 2021-11-13 ENCOUNTER — Ambulatory Visit: Payer: 59 | Admitting: Physical Therapy

## 2021-11-13 DIAGNOSIS — R262 Difficulty in walking, not elsewhere classified: Secondary | ICD-10-CM | POA: Diagnosis not present

## 2021-11-13 DIAGNOSIS — M79604 Pain in right leg: Secondary | ICD-10-CM

## 2021-11-13 DIAGNOSIS — M6281 Muscle weakness (generalized): Secondary | ICD-10-CM | POA: Diagnosis not present

## 2021-11-13 DIAGNOSIS — M1711 Unilateral primary osteoarthritis, right knee: Secondary | ICD-10-CM | POA: Diagnosis not present

## 2021-11-13 DIAGNOSIS — M1611 Unilateral primary osteoarthritis, right hip: Secondary | ICD-10-CM | POA: Diagnosis not present

## 2021-11-13 DIAGNOSIS — M25551 Pain in right hip: Secondary | ICD-10-CM | POA: Diagnosis not present

## 2021-11-13 DIAGNOSIS — M7611 Psoas tendinitis, right hip: Secondary | ICD-10-CM | POA: Diagnosis not present

## 2021-11-13 DIAGNOSIS — M7051 Other bursitis of knee, right knee: Secondary | ICD-10-CM | POA: Diagnosis not present
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
## 2021-11-15 ENCOUNTER — Encounter: Payer: 59 | Admitting: Physical Therapy

## 2021-11-20 ENCOUNTER — Encounter: Payer: 59 | Admitting: Physical Therapy

## 2021-11-22 ENCOUNTER — Encounter: Payer: 59 | Admitting: Physical Therapy

## 2021-11-27 ENCOUNTER — Encounter: Payer: Self-pay | Admitting: Physical Therapy

## 2021-11-27 ENCOUNTER — Ambulatory Visit: Payer: 59 | Attending: Family Medicine | Admitting: Physical Therapy

## 2021-11-27 DIAGNOSIS — R262 Difficulty in walking, not elsewhere classified: Secondary | ICD-10-CM | POA: Insufficient documentation

## 2021-11-27 DIAGNOSIS — M6281 Muscle weakness (generalized): Secondary | ICD-10-CM | POA: Insufficient documentation

## 2021-11-27 DIAGNOSIS — M79604 Pain in right leg: Secondary | ICD-10-CM | POA: Insufficient documentation
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
## 2021-11-28 ENCOUNTER — Encounter: Payer: Self-pay | Admitting: Family Medicine

## 2021-11-28 ENCOUNTER — Other Ambulatory Visit: Payer: Self-pay | Admitting: Family Medicine

## 2021-11-28 ENCOUNTER — Ambulatory Visit (INDEPENDENT_AMBULATORY_CARE_PROVIDER_SITE_OTHER): Payer: 59 | Admitting: Family Medicine

## 2021-11-28 VITALS — BP 128/82 | HR 78 | Ht 65.0 in | Wt 173.4 lb

## 2021-11-28 DIAGNOSIS — M7611 Psoas tendinitis, right hip: Secondary | ICD-10-CM

## 2021-11-28 DIAGNOSIS — M1611 Unilateral primary osteoarthritis, right hip: Secondary | ICD-10-CM

## 2021-11-28 DIAGNOSIS — M7051 Other bursitis of knee, right knee: Secondary | ICD-10-CM | POA: Diagnosis not present

## 2021-11-28 DIAGNOSIS — M1711 Unilateral primary osteoarthritis, right knee: Secondary | ICD-10-CM

## 2021-11-28 MED ORDER — BACLOFEN 5 MG PO TABS: 5.0000 | ORAL_TABLET | Freq: Three times a day (TID) | ORAL | 0 refills | Status: DC | PRN

## 2021-11-28 MED ORDER — TRAMADOL HCL 50 MG PO TABS: 50.0000 mg | ORAL_TABLET | Freq: Four times a day (QID) | ORAL | 0 refills | Status: AC | PRN

## 2021-11-28 MED ORDER — DICLOFENAC SODIUM 75 MG PO TBEC: 75.0000 mg | DELAYED_RELEASE_TABLET | Freq: Two times a day (BID) | ORAL | 0 refills | Status: AC

## 2021-11-28 NOTE — Assessment & Plan Note (Signed)
Patient with acute on chronic right hip pain in the setting of underlying osteoarthritis, additional history of avascular necrosis.  She recently had flare of right knee symptoms in the setting of right hip pain.  Her examination shows acutely positive FADIR testing, isolated psoas testing elicits pain, positive Faber, tenderness throughout the surrounding musculature.  Given her recalcitrant symptoms despite prior NSAIDs, physical therapy, and contraindication to repeat corticosteroid injection intra-articularly due to history of avascular necrosis, referral to orthopedic hip surgeon has been placed for further management options.  In the interim medication management has been performed for symptom control until she is able to establish with surgeon.

## 2021-11-28 NOTE — Progress Notes (Signed)
Primary Care / Sports Medicine Office Visit  Patient Information:  Patient ID: Debbie Harrington, female DOB: [birth year] Age: 56 y.o. MRN: [id number]   Debbie Harrington is a pleasant 56 y.o. female presenting with the following:  Chief Complaint  Patient presents with   Psoas tendinitis of right side    Hurts at night, unable to sleep. Went to ER with knee swollen, it has since went down.     Vitals:   11/28/21 1028  BP: 128/82  Pulse: 78  SpO2: 98%   Vitals:   11/28/21 1028  Weight: 173 lb 6.4 oz (78.7 kg)  Height: 5\' 5"  (1.651 m)   Body mass index is 28.86 kg/m.  No results found.   Independent interpretation of notes and tests performed by another provider:   None  Procedures performed:   None  Pertinent History, Exam, Impression, and Recommendations:   Problem List Items Addressed This Visit       Musculoskeletal and Integument   Primary osteoarthritis of right knee    Acute on chronic symptomatology in the setting of ipsilateral right hip pain.  She has been dosing diclofenac, baclofen sporadically, and attending PT to address pes anserine tendinopathy in addition to working with right hip and right knee conditioning.  Unfortunately, the pain progressed resulting in urgent care visit on 8/16, symptoms somewhat controlled with NSAIDs and Toradol injection.  She presents today for follow-up.  Examination reveals no effusion, mild tenderness diffusely about the knee at the joint lines, maximal tenderness at the pes anserine bursa, musculotendinous junction of the pes anserine tendons.  Provocative testing otherwise benign for the right knee.  Patient does have right knee osteoarthritis however symptoms are primarily linked to ipsilateral right hip osteoarthritis and associated relative deconditioning throughout.  For now patient was advised scheduled diclofenac course, continued as needed baclofen, and as needed tramadol.  We will be placing referral to  orthopedics surgeon for surgical evaluation of the right hip.  Recalcitrant symptoms of the right knee can be addressed with corticosteroid injections either intra-articularly or at the pes anserine bursa/peritendinous.      Relevant Medications   traMADol (ULTRAM) 50 MG tablet   diclofenac (VOLTAREN) 75 MG EC tablet   Baclofen 5 MG TABS   Primary osteoarthritis of right hip - Primary    Patient with acute on chronic right hip pain in the setting of underlying osteoarthritis, additional history of avascular necrosis.  She recently had flare of right knee symptoms in the setting of right hip pain.  Her examination shows acutely positive FADIR testing, isolated psoas testing elicits pain, positive Faber, tenderness throughout the surrounding musculature.  Given her recalcitrant symptoms despite prior NSAIDs, physical therapy, and contraindication to repeat corticosteroid injection intra-articularly due to history of avascular necrosis, referral to orthopedic hip surgeon has been placed for further management options.  In the interim medication management has been performed for symptom control until she is able to establish with surgeon.      Relevant Medications   traMADol (ULTRAM) 50 MG tablet   diclofenac (VOLTAREN) 75 MG EC tablet   Baclofen 5 MG TABS   Other Relevant Orders   Ambulatory referral to Orthopedic Surgery   Pes anserinus bursitis of right knee    See additional assessment(s) for plan details.      Relevant Medications   traMADol (ULTRAM) 50 MG tablet   diclofenac (VOLTAREN) 75 MG EC tablet   Baclofen 5 MG TABS   Other  Visit Diagnoses     Psoas tendinitis of right side       Relevant Medications   traMADol (ULTRAM) 50 MG tablet   diclofenac (VOLTAREN) 75 MG EC tablet   Baclofen 5 MG TABS        Orders & Medications Meds ordered this encounter  Medications   traMADol (ULTRAM) 50 MG tablet    Sig: Take 1 tablet (50 mg total) by mouth every 6 (six) hours as needed  for up to 5 days.    Dispense:  20 tablet    Refill:  0   diclofenac (VOLTAREN) 75 MG EC tablet    Sig: Take 1 tablet (75 mg total) by mouth 2 (two) times daily.    Dispense:  30 tablet    Refill:  0   Baclofen 5 MG TABS    Sig: Take 5-10 tablets by mouth 3 (three) times daily as needed.    Dispense:  90 tablet    Refill:  0   Orders Placed This Encounter  Procedures   Ambulatory referral to Orthopedic Surgery     No follow-ups on file.     Debbie Banana, MD   Primary Care Sports Medicine [facility]

## 2021-11-28 NOTE — Assessment & Plan Note (Signed)
See additional assessment(s) for plan details. 

## 2021-11-28 NOTE — Patient Instructions (Signed)
-   Dose diclofenac twice daily scheduled x7 days then as needed - Use nightly baclofen, 1-2 tablets for muscle tightness pain - Can use tramadol for symptoms not responding to the above - Hold from formal PT, continue home exercises as tolerated - Referral coordinator will contact you to schedule visit with orthopedic hip surgeon

## 2021-11-28 NOTE — Assessment & Plan Note (Signed)
Acute on chronic symptomatology in the setting of ipsilateral right hip pain.  She has been dosing diclofenac, baclofen sporadically, and attending PT to address pes anserine tendinopathy in addition to working with right hip and right knee conditioning.  Unfortunately, the pain progressed resulting in urgent care visit on 8/16, symptoms somewhat controlled with NSAIDs and Toradol injection.  She presents today for follow-up.  Examination reveals no effusion, mild tenderness diffusely about the knee at the joint lines, maximal tenderness at the pes anserine bursa, musculotendinous junction of the pes anserine tendons.  Provocative testing otherwise benign for the right knee.  Patient does have right knee osteoarthritis however symptoms are primarily linked to ipsilateral right hip osteoarthritis and associated relative deconditioning throughout.  For now patient was advised scheduled diclofenac course, continued as needed baclofen, and as needed tramadol.  We will be placing referral to orthopedics surgeon for surgical evaluation of the right hip.  Recalcitrant symptoms of the right knee can be addressed with corticosteroid injections either intra-articularly or at the pes anserine bursa/peritendinous.

## 2021-11-29 ENCOUNTER — Encounter: Payer: 59 | Admitting: Physical Therapy

## 2021-11-29 NOTE — Telephone Encounter (Signed)
Requested medication (s) are due for refill today - no  Requested medication (s) are on the active medication list -yes  Future visit scheduled -no  Last refill: 11/28/21  Notes to clinic: Pharmacy request script correction- see Sig directions  Requested Prescriptions  Pending Prescriptions Disp Refills   Baclofen 5 MG TABS [Pharmacy Med Name: BACLOFEN 5 MG TABLET] 90 tablet 0    Sig: Take 5-10 tablets by mouth 3 (three) times daily as needed.     Analgesics:  Muscle Relaxants - baclofen Passed - 11/28/2021 12:20 PM      Passed - Cr in normal range and within 180 days    Creatinine, Ser  Date Value Ref Range Status  07/18/2021 0.73 0.57 - 1.00 mg/dL Final         Passed - eGFR is 30 or above and within 180 days    eGFR  Date Value Ref Range Status  07/18/2021 96 >59 mL/min/1.73 Final         Passed - Valid encounter within last 6 months    Recent Outpatient Visits           Yesterday Primary osteoarthritis of right hip   [place] [place], Earley Abide, MD   1 month ago Psoas tendinitis of right side   [place] [facility] [place], Earley Abide, MD   2 months ago Cigarette nicotine dependence with withdrawal   [place] [facility] [place], Deanna C, MD   4 months ago Tobacco abuse counseling   [place] [facility] [place], Deanna C, MD   4 months ago Avascular necrosis of bone of right hip [facility])   [place] [facility] [place], MD                 Requested Prescriptions  Pending Prescriptions Disp Refills   Baclofen 5 MG TABS [Pharmacy Med Name: BACLOFEN 5 MG TABLET] 90 tablet 0    Sig: Take 5-10 tablets by mouth 3 (three) times daily as needed.     Analgesics:  Muscle Relaxants - baclofen Passed - 11/28/2021 12:20 PM      Passed - Cr in  normal range and within 180 days    Creatinine, Ser  Date Value Ref Range Status  07/18/2021 0.73 0.57 - 1.00 mg/dL Final         Passed - eGFR is 30 or above and within 180 days    eGFR  Date Value Ref Range Status  07/18/2021 96 >59 mL/min/1.73 Final         Passed - Valid encounter within last 6 months    Recent Outpatient Visits           Yesterday Primary osteoarthritis of right hip   [place] [place], Earley Abide, MD   1 month ago Psoas tendinitis of right side   [place] [facility] [place], Earley Abide, MD   2 months ago Cigarette nicotine dependence with withdrawal    [facility] at [place], Deanna C, MD   4 months ago Tobacco abuse counseling    [facility] at [place], Deanna C, MD   4 months ago Avascular necrosis of  bone of right hip [facility])   [place] [facility], Earley Abide, MD

## 2021-12-04 ENCOUNTER — Encounter: Payer: 59 | Admitting: Physical Therapy

## 2021-12-06 ENCOUNTER — Encounter: Payer: 59 | Admitting: Physical Therapy

## 2021-12-11 ENCOUNTER — Encounter: Payer: 59 | Admitting: Physical Therapy

## 2021-12-13 ENCOUNTER — Encounter: Payer: 59 | Admitting: Physical Therapy

## 2021-12-20 DIAGNOSIS — M25551 Pain in right hip: Secondary | ICD-10-CM | POA: Diagnosis not present

## 2021-12-20 DIAGNOSIS — M87051 Idiopathic aseptic necrosis of right femur: Secondary | ICD-10-CM | POA: Diagnosis not present

## 2021-12-20 DIAGNOSIS — M1611 Unilateral primary osteoarthritis, right hip: Secondary | ICD-10-CM | POA: Diagnosis not present

## 2022-04-01 DIAGNOSIS — E559 Vitamin D deficiency, unspecified: Secondary | ICD-10-CM | POA: Diagnosis not present

## 2022-04-01 DIAGNOSIS — Z87891 Personal history of nicotine dependence: Secondary | ICD-10-CM | POA: Diagnosis not present

## 2022-04-01 DIAGNOSIS — M1611 Unilateral primary osteoarthritis, right hip: Secondary | ICD-10-CM | POA: Diagnosis not present

## 2022-04-01 DIAGNOSIS — Z8739 Personal history of other diseases of the musculoskeletal system and connective tissue: Secondary | ICD-10-CM | POA: Diagnosis not present

## 2022-04-01 DIAGNOSIS — Z01818 Encounter for other preprocedural examination: Secondary | ICD-10-CM | POA: Diagnosis not present

## 2022-04-22 ENCOUNTER — Telehealth: Payer: Self-pay

## 2022-04-22 ENCOUNTER — Telehealth: Payer: Self-pay | Admitting: Family Medicine

## 2022-04-22 NOTE — Telephone Encounter (Signed)
Copied from [place] [id number]. Topic: General - Other >> Apr 22, 2022 12:01 PM Chapman Fitch wrote: Reason for CRM: [employer] called to see if fax was received for pts incontinence supplies / they faxed it to Dec 27th and Jan 11th / please advise asap

## 2022-04-22 NOTE — Telephone Encounter (Signed)
Called [employer] and explained to them that we have not seen pt for incontinence issues. I looked back to see if maybe she had seen an urologist in the past. I don't see that either, dating back to 2021. If pt is needing these, she will need to go to urology where they can give her the official diagnosis of incontinence issues. [employer] stated they would document this and call the pt.

## 2022-05-19 ENCOUNTER — Other Ambulatory Visit: Payer: Self-pay | Admitting: Family Medicine

## 2022-05-19 DIAGNOSIS — L739 Follicular disorder, unspecified: Secondary | ICD-10-CM

## 2022-11-13 IMAGING — CR DG HIP (WITH OR WITHOUT PELVIS) 2-3V*R*
3 series · 3 of 3 positions shown · non-contrast
Comparison: None.

CLINICAL DATA: Right leg, hip pain

EXAM:
DG HIP (WITH OR WITHOUT PELVIS) 2-3V RIGHT

[pelvis ap]
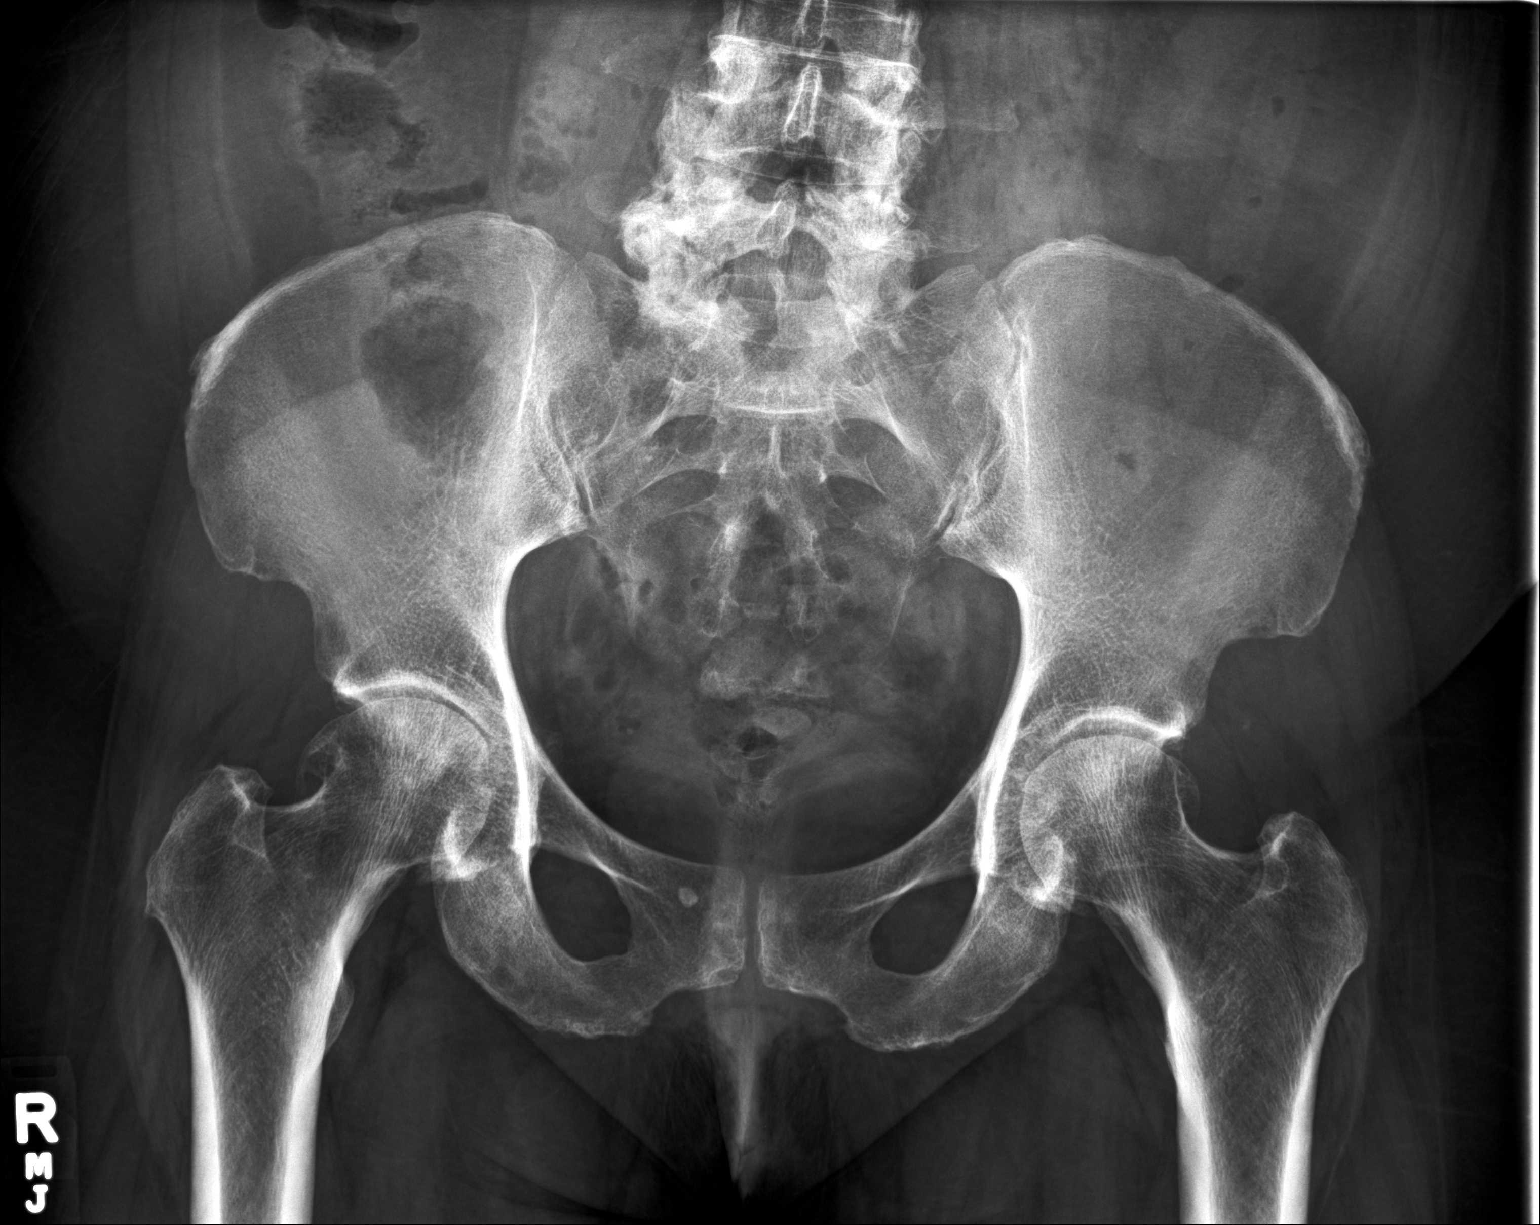

[hip ap]
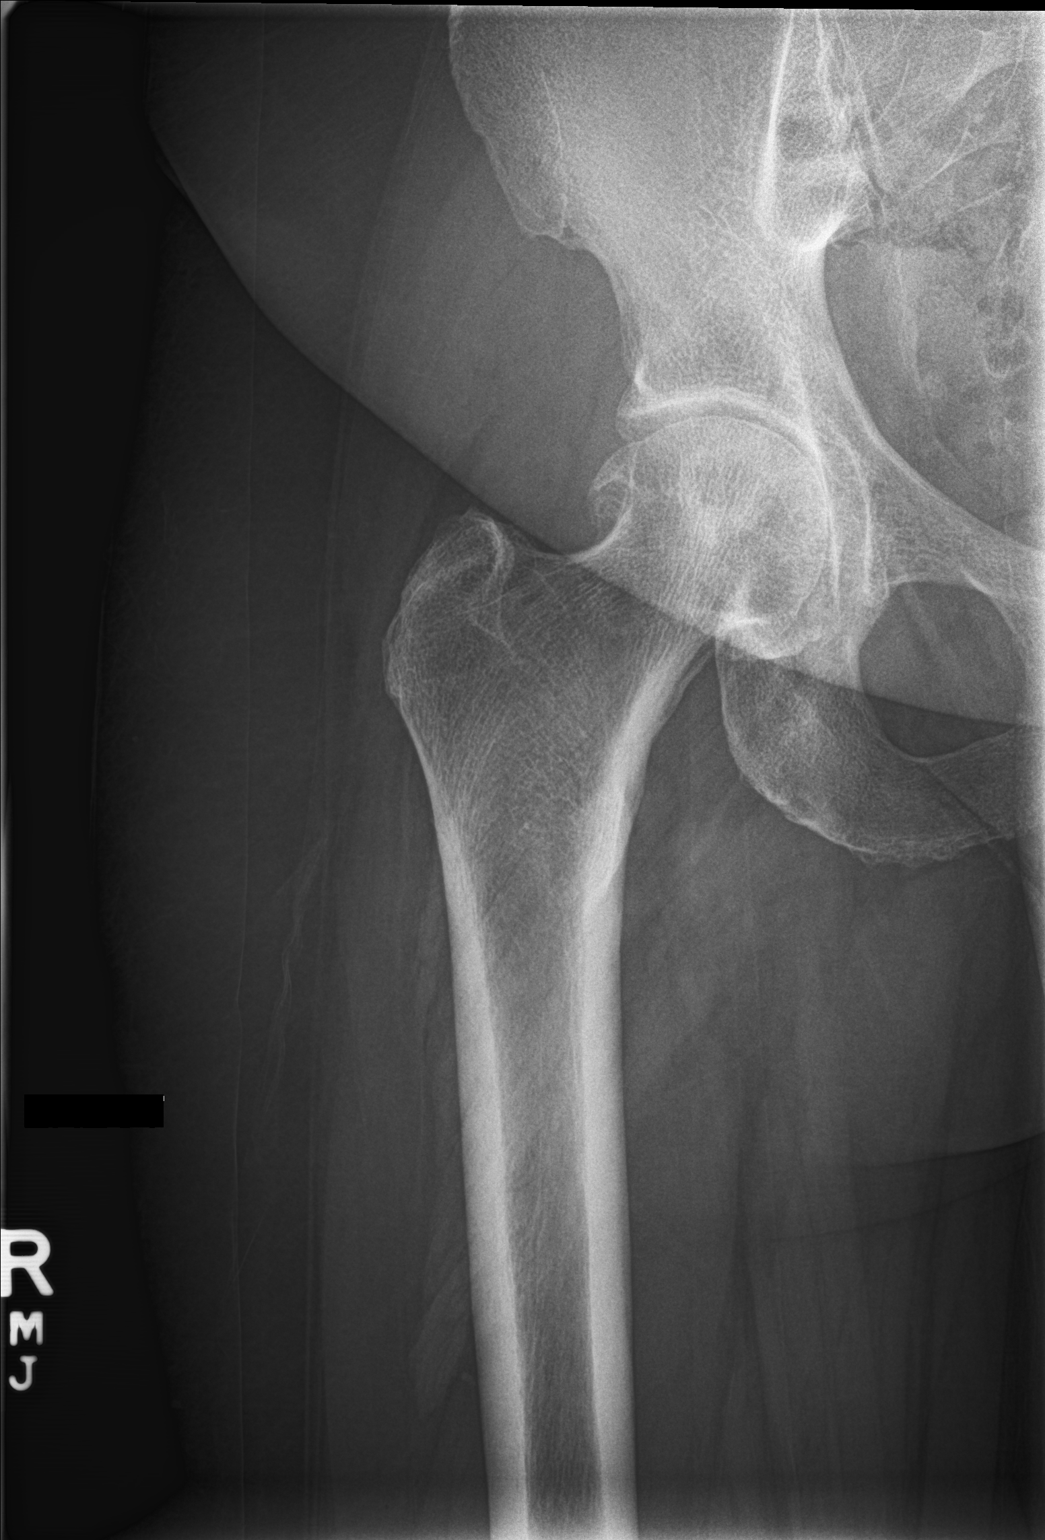

[hip lat]
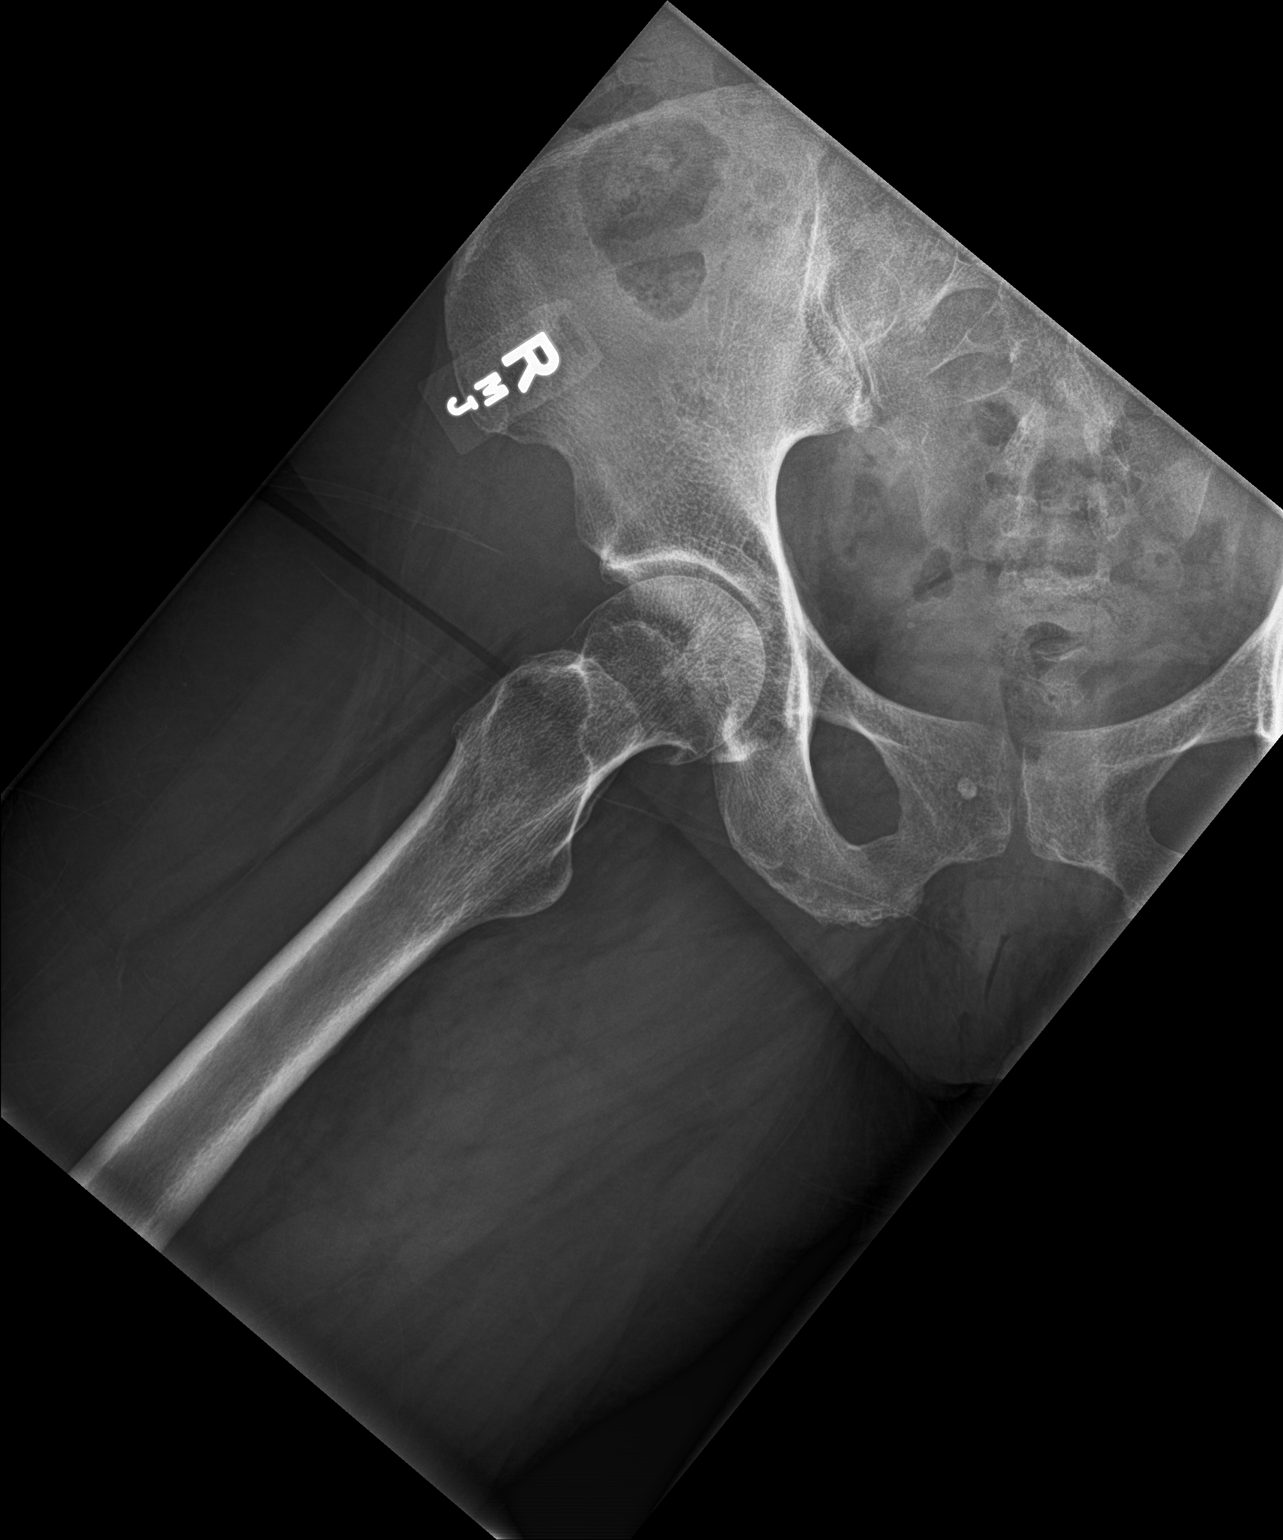

[3 of 3 positions shown; findings below may reference images not displayed]

FINDINGS: Normal alignment. No acute fracture or dislocation. Moderate
bilateral degenerative hip arthritis, right greater than left, with
asymmetric joint space narrowing and osteophyte formation. Facet
arthrosis is seen within the lower lumbar spine, not well profiled
on this exam. Soft tissues are unremarkable.
IMPRESSION: Bilateral degenerative hip arthritis, right greater than left.
